# Patient Record
Sex: Female | Born: 1965 | Race: Asian | Hispanic: No | Marital: Married | Smoking: Never smoker
Health system: Southern US, Community
[De-identification: ages and names within clinical notes are randomized; demographics above are authoritative.]

## PROBLEM LIST (undated history)

## (undated) DIAGNOSIS — E039 Hypothyroidism, unspecified: Secondary | ICD-10-CM

## (undated) DIAGNOSIS — F419 Anxiety disorder, unspecified: Secondary | ICD-10-CM

## (undated) HISTORY — DX: Anxiety disorder, unspecified: F41.9

## (undated) HISTORY — PX: LAPAROSCOPIC HYSTERECTOMY: SHX1926

## (undated) HISTORY — PX: THYROIDECTOMY, PARTIAL: SHX18

---

## 2008-06-09 ENCOUNTER — Ambulatory Visit: Payer: Self-pay | Admitting: Diagnostic Radiology

## 2008-06-09 ENCOUNTER — Ambulatory Visit (HOSPITAL_BASED_OUTPATIENT_CLINIC_OR_DEPARTMENT_OTHER): Admission: RE | Admit: 2008-06-09 | Discharge: 2008-06-09 | Payer: Self-pay | Admitting: Family Medicine

## 2008-06-15 ENCOUNTER — Encounter: Admission: RE | Admit: 2008-06-15 | Discharge: 2008-06-15 | Payer: Self-pay | Admitting: Family Medicine

## 2009-09-08 ENCOUNTER — Emergency Department (HOSPITAL_BASED_OUTPATIENT_CLINIC_OR_DEPARTMENT_OTHER): Admission: EM | Admit: 2009-09-08 | Discharge: 2009-09-08 | Payer: Self-pay | Admitting: Emergency Medicine

## 2013-10-17 DIAGNOSIS — M1712 Unilateral primary osteoarthritis, left knee: Secondary | ICD-10-CM | POA: Insufficient documentation

## 2013-10-17 DIAGNOSIS — M1711 Unilateral primary osteoarthritis, right knee: Secondary | ICD-10-CM | POA: Insufficient documentation

## 2014-08-15 DIAGNOSIS — M7989 Other specified soft tissue disorders: Secondary | ICD-10-CM | POA: Insufficient documentation

## 2014-08-15 DIAGNOSIS — E039 Hypothyroidism, unspecified: Secondary | ICD-10-CM | POA: Insufficient documentation

## 2015-07-26 ENCOUNTER — Ambulatory Visit (HOSPITAL_BASED_OUTPATIENT_CLINIC_OR_DEPARTMENT_OTHER)
Admission: RE | Admit: 2015-07-26 | Discharge: 2015-07-26 | Disposition: A | Discharge status: Home / Self Care | Payer: BLUE CROSS/BLUE SHIELD | Source: Ambulatory Visit | Attending: Internal Medicine | Admitting: Internal Medicine

## 2015-07-26 ENCOUNTER — Encounter (HOSPITAL_BASED_OUTPATIENT_CLINIC_OR_DEPARTMENT_OTHER): Payer: Self-pay

## 2015-07-26 ENCOUNTER — Other Ambulatory Visit (HOSPITAL_BASED_OUTPATIENT_CLINIC_OR_DEPARTMENT_OTHER): Payer: Self-pay | Admitting: Internal Medicine

## 2015-07-26 DIAGNOSIS — R1031 Right lower quadrant pain: Secondary | ICD-10-CM | POA: Insufficient documentation

## 2015-07-26 MED ORDER — IOPAMIDOL (ISOVUE-300) INJECTION 61%
100.0000 mL | Freq: Once | INTRAVENOUS | Status: AC | PRN
Start: 2015-07-26 — End: 2015-07-26
  Administered 2015-07-26: 100 mL via INTRAVENOUS

## 2018-09-11 DIAGNOSIS — E876 Hypokalemia: Secondary | ICD-10-CM | POA: Insufficient documentation

## 2018-09-11 DIAGNOSIS — T7840XA Allergy, unspecified, initial encounter: Secondary | ICD-10-CM | POA: Diagnosis not present

## 2018-09-11 DIAGNOSIS — R220 Localized swelling, mass and lump, head: Secondary | ICD-10-CM | POA: Diagnosis present

## 2018-09-12 ENCOUNTER — Emergency Department (HOSPITAL_COMMUNITY)
Admission: EM | Admit: 2018-09-12 | Discharge: 2018-09-12 | Disposition: A | Discharge status: Home / Self Care | Payer: BC Managed Care – PPO | Attending: Emergency Medicine | Admitting: Emergency Medicine

## 2018-09-12 ENCOUNTER — Other Ambulatory Visit: Payer: Self-pay

## 2018-09-12 ENCOUNTER — Encounter (HOSPITAL_COMMUNITY): Payer: Self-pay

## 2018-09-12 DIAGNOSIS — T7840XA Allergy, unspecified, initial encounter: Secondary | ICD-10-CM

## 2018-09-12 DIAGNOSIS — E876 Hypokalemia: Secondary | ICD-10-CM

## 2018-09-12 LAB — CBC WITH DIFFERENTIAL/PLATELET
Abs Immature Granulocytes: 0.03 10*3/uL (ref 0.00–0.07)
Basophils Absolute: 0 10*3/uL (ref 0.0–0.1)
Basophils Relative: 0 %
Eosinophils Absolute: 0 10*3/uL (ref 0.0–0.5)
Eosinophils Relative: 0 %
HCT: 41.1 % (ref 36.0–46.0)
Hemoglobin: 13.2 g/dL (ref 12.0–15.0)
Immature Granulocytes: 0 %
Lymphocytes Relative: 26 %
Lymphs Abs: 3.2 10*3/uL (ref 0.7–4.0)
MCH: 28.7 pg (ref 26.0–34.0)
MCHC: 32.1 g/dL (ref 30.0–36.0)
MCV: 89.3 fL (ref 80.0–100.0)
Monocytes Absolute: 0.9 10*3/uL (ref 0.1–1.0)
Monocytes Relative: 8 %
Neutro Abs: 8 10*3/uL — ABNORMAL HIGH (ref 1.7–7.7)
Neutrophils Relative %: 66 %
Platelets: 266 10*3/uL (ref 150–400)
RBC: 4.6 MIL/uL (ref 3.87–5.11)
RDW: 13 % (ref 11.5–15.5)
WBC: 12.2 10*3/uL — ABNORMAL HIGH (ref 4.0–10.5)
nRBC: 0 % (ref 0.0–0.2)

## 2018-09-12 LAB — BASIC METABOLIC PANEL
Anion gap: 9 (ref 5–15)
BUN: 16 mg/dL (ref 6–20)
CO2: 31 mmol/L (ref 22–32)
Calcium: 10.1 mg/dL (ref 8.9–10.3)
Chloride: 105 mmol/L (ref 98–111)
Creatinine, Ser: 0.73 mg/dL (ref 0.44–1.00)
GFR calc Af Amer: >60 mL/min (ref >60)
GFR calc non Af Amer: >60 mL/min (ref >60)
Glucose, Bld: 122 mg/dL — ABNORMAL HIGH (ref 70–99)
Potassium: 3.4 mmol/L — ABNORMAL LOW (ref 3.5–5.1)
Sodium: 145 mmol/L (ref 135–145)

## 2018-09-12 MED ORDER — DEXAMETHASONE 4 MG PO TABS
10.0000 mg | ORAL_TABLET | Freq: Once | ORAL | Status: AC
  Administered 2018-09-12: 10 mg via ORAL
  Filled 2018-09-12: qty 2

## 2018-09-12 MED ORDER — POTASSIUM CHLORIDE CRYS ER 20 MEQ PO TBCR
40.0000 meq | EXTENDED_RELEASE_TABLET | Freq: Once | ORAL | Status: AC
  Administered 2018-09-12: 40 meq via ORAL
  Filled 2018-09-12: qty 2

## 2018-09-12 MED ORDER — DIPHENHYDRAMINE HCL 50 MG/ML IJ SOLN
25.0000 mg | Freq: Once | INTRAMUSCULAR | Status: AC
  Administered 2018-09-12: 02:00:00 25 mg via INTRAVENOUS
  Filled 2018-09-12: qty 1

## 2018-09-12 NOTE — Discharge Instructions (Signed)
Take diphenhydramine (Benadryl) as needed.  Return if symptoms get worse.

## 2018-09-12 NOTE — ED Provider Notes (Signed)
[facility]-EMERGENCY DEPT Provider Note   CSN: [id number] Arrival date & time: 09/11/18  2359    History   Chief Complaint Chief Complaint  Patient presents with  . Facial Swelling    HPI Claire Sullivan is a 53 y.o. female.   The history is provided by the patient.  She noted onset at about midnight of pain and swelling in the left side of her face.  Pain and swelling have improved.  She currently rates pain at 3/10.  She denies any trauma denies any new medications.  She states that she is worried about her blood pressure being high and about a stroke, but she denies any weakness or numbness or tingling.  She did not do any treatment for this.  Pain was just in the left cheek with no extension to the neck or to the forehead.  History reviewed. No pertinent past medical history.  There are no active problems to display for this patient.   History reviewed. No pertinent surgical history.   OB History   No obstetric history on file.      Home Medications    Prior to Admission medications   Not on File    Family History History reviewed. No pertinent family history.  Social History Social History   Tobacco Use  . Smoking status: Not on file  Substance Use Topics  . Alcohol use: Not on file  . Drug use: Not on file     Allergies   Patient has no known allergies.   Review of Systems Review of Systems  All other systems reviewed and are negative.    Physical Exam Updated Vital Signs BP 134/86 (BP Location: Left Arm)   Pulse 78   Temp 98.1 F (36.7 C) (Oral)   Resp 18   Ht 5\' 5"  (1.651 m)   Wt 80.7 kg   SpO2 100%   BMI 29.62 kg/m   Physical Exam Vitals signs and nursing note reviewed.    53 year old female, resting comfortably and in no acute distress. Vital signs are normal. Oxygen saturation is 100%, which is normal. Head is normocephalic and atraumatic. PERRLA, EOMI. Oropharynx is clear.  No obvious swelling or  induration.  No edema of the uvula.  No sublingual swelling. Neck is nontender and supple without adenopathy or JVD. Back is nontender and there is no CVA tenderness. Lungs are clear without rales, wheezes, or rhonchi. Chest is nontender. Heart has regular rate and rhythm without murmur. Abdomen is soft, flat, nontender without masses or hepatosplenomegaly and peristalsis is normoactive. Extremities have no cyanosis or edema, full range of motion is present. Skin is warm and dry without rash. Neurologic: Mental status is normal, cranial nerves are intact, there are no motor or sensory deficits.  ED Treatments / Results  Labs (all labs ordered are listed, but only abnormal results are displayed) Labs Reviewed  BASIC METABOLIC PANEL - Abnormal; Notable for the following components:      Result Value   Potassium 3.4 (*)    Glucose, Bld 122 (*)    All other components within normal limits  CBC WITH DIFFERENTIAL/PLATELET - Abnormal; Notable for the following components:   WBC 12.2 (*)    Neutro Abs 8.0 (*)    All other components within normal limits    Procedures Procedures  Medications Ordered in ED Medications  dexamethasone (DECADRON) tablet 10 mg (has no administration in time range)  diphenhydrAMINE (BENADRYL) injection 25 mg (25 mg Intravenous  Given 09/12/18 0201)  potassium chloride SA (K-DUR) CR tablet 40 mEq (40 mEq Oral Given 09/12/18 0328)     Initial Impression / Assessment and Plan / ED Course  I have reviewed the triage vital signs and the nursing notes.  Pertinent lab results that were available during my care of the patient were reviewed by me and considered in my medical decision making (see chart for details).  Facial pain with subjective complaint of swelling with no objective swelling on exam.  Etiology is unclear but suspect possible allergic reaction.  Patient expressed concern about hypertension but blood pressure is in the normal range.  Patient expressed  concern about stroke, but no physical findings to suggest stroke.  She will be given IV diphenhydramine and reassessed.  Old records are reviewed, and she has no relevant past visits.  Labs are significant for mild hypokalemia, and she is given a dose of oral potassium.  Symptoms improved dramatically with above-noted treatment.  She is given a dose of dexamethasone and is discharged with instructions use over-the-counter diphenhydramine as needed.  Return precautions discussed.  Final Clinical Impressions(s) / ED Diagnoses   Final diagnoses:  Allergic reaction, initial encounter  Hypokalemia    ED Discharge Orders    None       Delora Fuel, MD 19/50/93 [phone]

## 2018-09-12 NOTE — ED Triage Notes (Signed)
Pt reports swelling on the L side of her face that started about 30 minutes ago. States that it has gone down some, but swelling noted to L side of neck. She endorses pain in her whole face.

## 2020-06-28 ENCOUNTER — Emergency Department (HOSPITAL_BASED_OUTPATIENT_CLINIC_OR_DEPARTMENT_OTHER)
Admission: EM | Admit: 2020-06-28 | Discharge: 2020-06-29 | Disposition: A | Discharge status: Home / Self Care | Payer: BC Managed Care – PPO | Attending: Emergency Medicine | Admitting: Emergency Medicine

## 2020-06-28 ENCOUNTER — Encounter (HOSPITAL_BASED_OUTPATIENT_CLINIC_OR_DEPARTMENT_OTHER): Payer: Self-pay

## 2020-06-28 ENCOUNTER — Emergency Department (HOSPITAL_BASED_OUTPATIENT_CLINIC_OR_DEPARTMENT_OTHER): Payer: BC Managed Care – PPO | Admitting: Radiology

## 2020-06-28 ENCOUNTER — Other Ambulatory Visit: Payer: Self-pay

## 2020-06-28 DIAGNOSIS — N281 Cyst of kidney, acquired: Secondary | ICD-10-CM | POA: Diagnosis not present

## 2020-06-28 DIAGNOSIS — R0789 Other chest pain: Secondary | ICD-10-CM | POA: Diagnosis not present

## 2020-06-28 DIAGNOSIS — R1013 Epigastric pain: Secondary | ICD-10-CM | POA: Diagnosis present

## 2020-06-28 DIAGNOSIS — E039 Hypothyroidism, unspecified: Secondary | ICD-10-CM | POA: Insufficient documentation

## 2020-06-28 HISTORY — DX: Hypothyroidism, unspecified: E03.9

## 2020-06-28 LAB — COMPREHENSIVE METABOLIC PANEL
ALT: 43 U/L (ref 0–44)
AST: 34 U/L (ref 15–41)
Albumin: 3.8 g/dL (ref 3.5–5.0)
Alkaline Phosphatase: 88 U/L (ref 38–126)
Anion gap: 7 (ref 5–15)
BUN: 14 mg/dL (ref 6–20)
CO2: 28 mmol/L (ref 22–32)
Calcium: 9.3 mg/dL (ref 8.9–10.3)
Chloride: 106 mmol/L (ref 98–111)
Creatinine, Ser: 0.71 mg/dL (ref 0.44–1.00)
GFR, Estimated: >60 mL/min (ref >60)
Glucose, Bld: 128 mg/dL — ABNORMAL HIGH (ref 70–99)
Potassium: 3.5 mmol/L (ref 3.5–5.1)
Sodium: 141 mmol/L (ref 135–145)
Total Bilirubin: 0.2 mg/dL — ABNORMAL LOW (ref 0.3–1.2)
Total Protein: 6.3 g/dL — ABNORMAL LOW (ref 6.5–8.1)

## 2020-06-28 LAB — CBC WITH DIFFERENTIAL/PLATELET
Abs Immature Granulocytes: 0.04 10*3/uL (ref 0.00–0.07)
Basophils Absolute: 0.1 10*3/uL (ref 0.0–0.1)
Basophils Relative: 1 %
Eosinophils Absolute: 4.3 10*3/uL — ABNORMAL HIGH (ref 0.0–0.5)
Eosinophils Relative: 33 %
HCT: 40.2 % (ref 36.0–46.0)
Hemoglobin: 13.3 g/dL (ref 12.0–15.0)
Immature Granulocytes: 0 %
Lymphocytes Relative: 30 %
Lymphs Abs: 3.7 10*3/uL (ref 0.7–4.0)
MCH: 28.5 pg (ref 26.0–34.0)
MCHC: 33.1 g/dL (ref 30.0–36.0)
MCV: 86.1 fL (ref 80.0–100.0)
Monocytes Absolute: 0.7 10*3/uL (ref 0.1–1.0)
Monocytes Relative: 5 %
Neutro Abs: 3.9 10*3/uL (ref 1.7–7.7)
Neutrophils Relative %: 31 %
Platelets: 297 10*3/uL (ref 150–400)
RBC: 4.67 MIL/uL (ref 3.87–5.11)
RDW: 13.2 % (ref 11.5–15.5)
WBC: 12.7 10*3/uL — ABNORMAL HIGH (ref 4.0–10.5)
nRBC: 0 % (ref 0.0–0.2)

## 2020-06-28 LAB — TROPONIN I (HIGH SENSITIVITY): Troponin I (High Sensitivity): <2 ng/L (ref <18)

## 2020-06-28 LAB — D-DIMER, QUANTITATIVE: D-Dimer, Quant: 0.9 ug/mL-FEU — ABNORMAL HIGH (ref 0.00–0.50)

## 2020-06-28 LAB — LIPASE, BLOOD: Lipase: 24 U/L (ref 11–51)

## 2020-06-28 MED ORDER — PANTOPRAZOLE SODIUM 40 MG IV SOLR
40.0000 mg | Freq: Once | INTRAVENOUS | Status: AC
  Administered 2020-06-29: 40 mg via INTRAVENOUS
  Filled 2020-06-28: qty 40

## 2020-06-28 MED ORDER — SUCRALFATE 1 GM/10ML PO SUSP
1.0000 g | Freq: Once | ORAL | Status: AC
  Administered 2020-06-29: 1 g via ORAL
  Filled 2020-06-28: qty 10

## 2020-06-28 NOTE — ED Triage Notes (Signed)
Patient here POV with Family Member with ABD Pain and CP.  ABD Pain has been present since returning from [place] approximately 1 month PTA and was prescribed PO Antibiotics for N/V/D by PCP.   Patient here today due to worsening CP that began 5-6 days PTA.   Ambulatory, GCS 15.

## 2020-06-28 NOTE — ED Provider Notes (Signed)
[facility]ERGENC[facility] Provider Note: Lowella Dell, MD, FACEP  CSN: [id number] MRN: [id number] ARRIVAL: 06/28/20 at 2222 ROOM: DB011/DB011   CHIEF COMPLAINT  Abdominal Pain and Chest Pain   HISTORY OF PRESENT ILLNESS  06/28/20 10:49 PM Claire Sullivan is a 54 y.o. female who went to [place] from 05/06/2020 through 05/31/2020.  While she was in [place] she had diarrhea that was exacerbated anytime she ate.  She did not have abdominal pain at that time.  On returning from [place] she developed epigastric discomfort which is persisted.  She was recently treated with a course of ciprofloxacin and Flagyl which she completed 2 days ago.  She was then started on nystatin 2 days ago for reasons that are not clear.  She continues to have the epigastric pain which she currently rates as a 5 out of 10, worse with palpation and worse with eating.  For the past week when she eats the pain radiates up into her chest, notably her left upper chest which she describes as not a pain but a sensation that she is going to die.  This chest discomfort is intermittently present even when she has not recently eaten.  It is exacerbated by deep breathing.  She thinks it may also be brought on by stress.  She has had nausea but no vomiting.  Her diarrhea has resolved.   Past Medical History:  Diagnosis Date  . Hypothyroidism     History reviewed. No pertinent surgical history.  No family history on file.  Social History   Tobacco Use  . Smoking status: Never Smoker  . Smokeless tobacco: Never Used  Substance Use Topics  . Alcohol use: Never  . Drug use: Never    Prior to Admission medications   Medication Sig Start Date End Date Taking? Authorizing Provider  ondansetron (ZOFRAN ODT) 8 MG disintegrating tablet Take 1 tablet (8 mg total) by mouth every 8 (eight) hours as needed for nausea or vomiting. 06/29/20  Yes Tanijah Morais, MD  pantoprazole (PROTONIX) 20 MG tablet Take 1 tablet (20 mg total) by mouth daily.  06/29/20  Yes Angelito Hopping, MD    Allergies Patient has no known allergies.   REVIEW OF SYSTEMS  Negative except as noted here or in the History of Present Illness.   PHYSICAL EXAMINATION  Initial Vital Signs Blood pressure (!) 144/90, pulse 72, temperature 98.7 F (37.1 C), temperature source Oral, resp. rate 14, height 5\' 5"  (1.651 m), weight 80.7 kg, SpO2 100 %.  Examination General: Well-developed, well-nourished female in no acute distress; appearance consistent with age of record HENT: normocephalic; atraumatic Eyes: pupils equal, round and reactive to light; extraocular muscles intact Neck: supple Heart: regular rate and rhythm Lungs: clear to auscultation bilaterally Chest: Nontender Abdomen: soft; nondistended; epigastric tenderness; bowel sounds present Extremities: No deformity; full range of motion; pulses normal Neurologic: Awake, alert and oriented; motor function intact in all extremities and symmetric; no facial droop Skin: Warm and dry Psychiatric: Normal mood and affect   RESULTS  Summary of this visit's results, reviewed and interpreted by myself:   EKG Interpretation  Date/Time:  Thursday Jun 28 2020 22:37:04 EDT Ventricular Rate:  71 PR Interval:  121 QRS Duration: 90 QT Interval:  391 QTC Calculation: 425 R Axis:   83 Text Interpretation: Duplicate Delete Confirmed by [id number] (Paula Libra) on 06/28/2020 10:40:16 PM      Laboratory Studies: Results for orders placed or performed during the hospital encounter of 06/28/20 (from the past 24  hour(s))  CBC with Differential/Platelet     Status: Abnormal   Collection Time: 06/28/20 11:02 PM  Result Value Ref Range   WBC 12.7 (H) 4.0 - 10.5 K/uL   RBC 4.67 3.87 - 5.11 MIL/uL   Hemoglobin 13.3 12.0 - 15.0 g/dL   HCT 51.7 61.6 - 07.3 %   MCV 86.1 80.0 - 100.0 fL   MCH 28.5 26.0 - 34.0 pg   MCHC 33.1 30.0 - 36.0 g/dL   RDW 71.0 62.6 - 94.8 %   Platelets 297 150 - 400 K/uL   nRBC 0.0 0.0 - 0.2 %    Neutrophils Relative % 31 %   Neutro Abs 3.9 1.7 - 7.7 K/uL   Lymphocytes Relative 30 %   Lymphs Abs 3.7 0.7 - 4.0 K/uL   Monocytes Relative 5 %   Monocytes Absolute 0.7 0.1 - 1.0 K/uL   Eosinophils Relative 33 %   Eosinophils Absolute 4.3 (H) 0.0 - 0.5 K/uL   Basophils Relative 1 %   Basophils Absolute 0.1 0.0 - 0.1 K/uL   Immature Granulocytes 0 %   Abs Immature Granulocytes 0.04 0.00 - 0.07 K/uL  Comprehensive metabolic panel     Status: Abnormal   Collection Time: 06/28/20 11:02 PM  Result Value Ref Range   Sodium 141 135 - 145 mmol/L   Potassium 3.5 3.5 - 5.1 mmol/L   Chloride 106 98 - 111 mmol/L   CO2 28 22 - 32 mmol/L   Glucose, Bld 128 (H) 70 - 99 mg/dL   BUN 14 6 - 20 mg/dL   Creatinine, Ser 5.46 0.44 - 1.00 mg/dL   Calcium 9.3 8.9 - 27.0 mg/dL   Total Protein 6.3 (L) 6.5 - 8.1 g/dL   Albumin 3.8 3.5 - 5.0 g/dL   AST 34 15 - 41 U/L   ALT 43 0 - 44 U/L   Alkaline Phosphatase 88 38 - 126 U/L   Total Bilirubin 0.2 (L) 0.3 - 1.2 mg/dL   GFR, Estimated >35 >00 mL/min   Anion gap 7 5 - 15  Lipase, blood     Status: None   Collection Time: 06/28/20 11:02 PM  Result Value Ref Range   Lipase 24 11 - 51 U/L  Troponin I (High Sensitivity)     Status: None   Collection Time: 06/28/20 11:02 PM  Result Value Ref Range   Troponin I (High Sensitivity) <2 <18 ng/L  D-dimer, quantitative     Status: Abnormal   Collection Time: 06/28/20 11:20 PM  Result Value Ref Range   D-Dimer, Quant 0.90 (H) 0.00 - 0.50 ug/mL-FEU   Imaging Studies: DG Chest 2 View  Result Date: 06/28/2020 CLINICAL DATA:  Left upper chest discomfort since returning from [place] 1 month prior to admission EXAM: CHEST - 2 VIEW COMPARISON:  None. FINDINGS: Mildly coarsened interstitial changes are nonspecific. No consolidative opacity, convincing edema, pneumothorax or effusion. The cardiomediastinal contours are unremarkable. No acute osseous or soft tissue abnormality. Telemetry leads overlie the chest.  IMPRESSION: Nonspecific coarsened interstitial changes, can be infectious, inflammatory or chronic. No other acute cardiopulmonary abnormality. Electronically Signed   By: Kreg Shropshire M.D.   On: 06/28/2020 23:20   CT Angio Chest PE W and/or Wo Contrast  Result Date: 06/29/2020 CLINICAL DATA:  Abdominal pain. EXAM: CT ANGIOGRAPHY CHEST WITH CONTRAST TECHNIQUE: Multidetector CT imaging of the chest was performed using the standard protocol during bolus administration of intravenous contrast. Multiplanar CT image reconstructions and MIPs were obtained to evaluate  the vascular anatomy. CONTRAST:  OMNIPAQUE IOHEXOL 350 MG/ML SOLN COMPARISON:  None. FINDINGS: Cardiovascular: Satisfactory opacification of the pulmonary arteries to the segmental level. No evidence of pulmonary embolism. Normal heart size. No pericardial effusion. Mediastinum/Nodes: There is mild right hilar lymphadenopathy. Thyroid gland, trachea, and esophagus demonstrate no significant findings. Lungs/Pleura: Lungs are clear. No pleural effusion or pneumothorax. Upper Abdomen: There is a small hiatal hernia. A 7.2 cm x 5.4 cm cystic appearing area is seen within the posterior aspect of the left upper quadrant. Musculoskeletal: No chest wall abnormality. No acute or significant osseous findings. Review of the MIP images confirms the above findings. IMPRESSION: 1. No evidence of pulmonary embolism or acute cardiopulmonary disease. 2. Large posterior left upper quadrant cyst. This may be renal in origin. Correlation with renal ultrasound is recommended. Electronically Signed   By: Aram Candela M.D.   On: 06/29/2020 00:30   US Renal  Result Date: 06/29/2020 CLINICAL DATA:  Chest and abdominal pain EXAM: RENAL / URINARY TRACT ULTRASOUND COMPLETE COMPARISON:  CT 06/29/2020 FINDINGS: Right Kidney: Renal measurements: 10.6 x 4.3 x 5.1 cm = volume: 100.9 mL. Echogenicity is within normal limits. No concerning renal mass, shadowing calculus or  hydronephrosis. Left Kidney: Renal measurements: 14.2 x 6.3 x 5.7 cm = volume: 265.8 mL. Solitary large predominantly anechoic cyst with minimal internal echogenic debris arising from the upper pole left kidney measuring 7.4 x 7.5 x 7.2 cm in size. No other focal renal lesion, shadowing calculus or hydronephrosis. Echogenicity is within normal limits. Bladder: Appears normal for degree of bladder distention. Other: None. IMPRESSION: Large cyst seen on comparison CT appears to arise from the upper pole left kidney with only minimal internal complexity with few low level internal echoes/debris. No acute or concerning features are evident. Otherwise unremarkable renal ultrasound. Electronically Signed   By: Kreg Shropshire M.D.   On: 06/29/2020 01:55    ED COURSE and MDM  Nursing notes, initial and subsequent vitals signs, including pulse oximetry, reviewed and interpreted by myself.  Vitals:   06/28/20 2330 06/29/20 0000 06/29/20 0030 06/29/20 0100  BP: 134/84 140/86 136/90 129/79  Pulse: 65 63 67 71  Resp: 17 16 17 17   Temp:      TempSrc:      SpO2: 100% 99% 100% 100%  Weight:      Height:       Medications  pantoprazole (PROTONIX) injection 40 mg (40 mg Intravenous Given 06/29/20 0019)  sucralfate (CARAFATE) 1 GM/10ML suspension 1 g (1 g Oral Given 06/29/20 0019)  iohexol (OMNIPAQUE) 350 MG/ML injection 100 mL (100 mLs Intravenous Contrast Given 06/29/20 0006)   1:59 AM Patient got symptomatic relief from oral Carafate.  She was also given IV Protonix.  I suspect her symptoms may be due to gastritis and/or acid reflux.  We will start her on a PPI.  She has no significant laboratory abnormalities and imaging studies are significant only for a benign-appearing left renal cyst.   PROCEDURES  Procedures   ED DIAGNOSES     ICD-10-CM   1. Epigastric pain  R10.13   2. Discomfort in chest  R07.89   3. Acquired renal cyst of left kidney  N28.1       Geneveive Furness, MD 06/29/20 0202

## 2020-06-29 ENCOUNTER — Emergency Department (HOSPITAL_BASED_OUTPATIENT_CLINIC_OR_DEPARTMENT_OTHER): Payer: BC Managed Care – PPO

## 2020-06-29 ENCOUNTER — Encounter (HOSPITAL_BASED_OUTPATIENT_CLINIC_OR_DEPARTMENT_OTHER): Payer: Self-pay | Admitting: Emergency Medicine

## 2020-06-29 LAB — URINALYSIS, ROUTINE W REFLEX MICROSCOPIC
Bilirubin Urine: NEGATIVE
Glucose, UA: NEGATIVE mg/dL
Ketones, ur: NEGATIVE mg/dL
Nitrite: NEGATIVE
Protein, ur: NEGATIVE mg/dL
Specific Gravity, Urine: 1.024 (ref 1.005–1.030)
pH: 7 (ref 5.0–8.0)

## 2020-06-29 MED ORDER — IOHEXOL 350 MG/ML SOLN
100.0000 mL | Freq: Once | INTRAVENOUS | Status: AC | PRN
  Administered 2020-06-29: 100 mL via INTRAVENOUS

## 2020-06-29 MED ORDER — PANTOPRAZOLE SODIUM 20 MG PO TBEC: 20.0000 mg | DELAYED_RELEASE_TABLET | Freq: Every day | ORAL | 0 refills | Status: DC

## 2020-06-29 MED ORDER — ONDANSETRON 8 MG PO TBDP: 8.0000 mg | ORAL_TABLET | Freq: Three times a day (TID) | ORAL | 0 refills | Status: DC | PRN

## 2020-07-09 ENCOUNTER — Telehealth: Payer: Self-pay

## 2020-07-09 NOTE — Telephone Encounter (Signed)
Called and left voicemail for patient to call and schedule an appointment. Patient will be worked in Tuesday or Friday. Need to schedule with Nehemiah Settle

## 2020-07-13 ENCOUNTER — Encounter: Payer: Self-pay | Admitting: Gastroenterology

## 2020-07-13 ENCOUNTER — Other Ambulatory Visit: Payer: Self-pay

## 2020-07-13 ENCOUNTER — Ambulatory Visit (HOSPITAL_BASED_OUTPATIENT_CLINIC_OR_DEPARTMENT_OTHER)
Admission: RE | Admit: 2020-07-13 | Discharge: 2020-07-13 | Disposition: A | Discharge status: Home / Self Care | Payer: BC Managed Care – PPO | Source: Ambulatory Visit | Attending: Gastroenterology | Admitting: Gastroenterology

## 2020-07-13 ENCOUNTER — Ambulatory Visit (INDEPENDENT_AMBULATORY_CARE_PROVIDER_SITE_OTHER): Payer: BC Managed Care – PPO | Admitting: Gastroenterology

## 2020-07-13 VITALS — BP 150/98 | HR 79 | Ht 65.0 in | Wt 173.2 lb

## 2020-07-13 DIAGNOSIS — R1013 Epigastric pain: Secondary | ICD-10-CM

## 2020-07-13 DIAGNOSIS — R1084 Generalized abdominal pain: Secondary | ICD-10-CM

## 2020-07-13 DIAGNOSIS — R52 Pain, unspecified: Secondary | ICD-10-CM | POA: Diagnosis not present

## 2020-07-13 MED ORDER — IOHEXOL 300 MG/ML  SOLN
75.0000 mL | Freq: Once | INTRAMUSCULAR | Status: AC
  Administered 2020-07-13: 75 mL via INTRAVENOUS

## 2020-07-13 MED ORDER — AMBULATORY NON FORMULARY MEDICATION: 30.0000 mL | Freq: Four times a day (QID) | 2 refills | Status: DC | PRN

## 2020-07-13 NOTE — Progress Notes (Signed)
Chief Complaint:   Referring Provider:  Galvin Proffer, MD      ASSESSMENT AND PLAN;   #1. Epi pain  #2. Gen abdo pain with RLQ tenderness. R/O acute appendicitis.   Plan: -CT AP with PO IV contrast STAT -If CT is negative, proceed with EGD for further evaluation. -For now continue Protonix 40 mg p.o. once a day -Trial of GI cocktail (equal amounts of viscous lidocaine, Maalox and liquid Bentyl) -I have reviewed the CTA chest, labs, recent ED visit in detail.   HPI:    Claire Sullivan is a 55 y.o. female  Accompanied by her husband Seen as emergency workin (cousin of Dr.Haque) Visited [place] 3/13-05/31/2020 Had severe diarrhea, treated with cipro/flagyl with good relief Now with severe epigastric/generalized abdominal pain with postprandial abdominal burning, bloating.  No fevers but does feel chilly.  Has significant postprandial heartburn.  This was associated with some shortness of breath.  Evaluated in ED on 06/28/2020 underwent CTA chest-negative for PE, stable large 7.5 cm posterior left kidney cyst, confirmed by renal ultrasound.  She had mildly elevated WBC count at 12.7 with normal hemoglobin 13.3, normal CMP except glucose 128, normal lipase.  MI was ruled out.  She was given IV Protonix, Carafate.  Sent to GI clinic for further evaluation.  Lately has been having lower abdominal pain without diarrhea.  No melena or hematochezia.  She denies having any nausea or vomiting.  Very distressed with symptoms.  Has associated significant situational anxiety (both daughter-in-laws stayed back in [place])    Past Medical History:  Diagnosis Date  . Hypothyroidism     Past Surgical History:  Procedure Laterality Date  . LAPAROSCOPIC HYSTERECTOMY      Family History  Problem Relation Age of Onset  . CVA Mother   . Kidney disease Father     Social History   Tobacco Use  . Smoking status: Never Smoker  . Smokeless tobacco: Never Used  Vaping Use  . Vaping  Use: Never used  Substance Use Topics  . Alcohol use: Never  . Drug use: Never   . Current Outpatient Medications  Medication Sig Dispense Refill  . alprazolam (XANAX) 2 MG tablet Take by mouth.    . levothyroxine (SYNTHROID) 150 MCG tablet Take 150 mcg by mouth.    . pantoprazole (PROTONIX) 20 MG tablet Take 1 tablet (20 mg total) by mouth daily. 30 tablet 0   No current facility-administered medications for this visit.    No Known Allergies  Review of Systems:  Constitutional: Denies fever, chills, diaphoresis, has appetite change and fatigue.  HEENT: Denies photophobia, eye pain, redness, hearing loss, ear pain, congestion, sore throat, rhinorrhea, sneezing, mouth sores, neck pain, neck stiffness and tinnitus.   Respiratory: Denies SOB, DOE, cough, chest tightness,  and wheezing.   Cardiovascular: Denies chest pain, palpitations and leg swelling.  Genitourinary: Denies dysuria, urgency, frequency, hematuria, flank pain and difficulty urinating.  Musculoskeletal: Denies myalgias, back pain, joint swelling, arthralgias and gait problem.  Skin: No rash.  Neurological: Denies dizziness, seizures, syncope, weakness, light-headedness, numbness and headaches.  Hematological: Denies adenopathy. Easy bruising, personal or family bleeding history  Psychiatric/Behavioral: has anxiety or depression     Physical Exam:    BP (!) 150/98 (BP Location: Left Arm, Patient Position: Sitting, Cuff Size: Normal)   Pulse 79   Ht 5\' 5"  (1.651 m)   Wt 173 lb 4 oz (78.6 kg)   BMI 28.83 kg/m  Wt Readings from Last  3 Encounters:  07/13/20 173 lb 4 oz (78.6 kg)  06/28/20 177 lb 14.6 oz (80.7 kg)  09/12/18 178 lb (80.7 kg)   Constitutional:  Well-developed, in no acute distress. Psychiatric: Normal mood and affect. Behavior is normal. HEENT: Pupils normal.  Conjunctivae are normal. No scleral icterus. Neck supple.  Cardiovascular: Normal rate, regular rhythm. No edema Pulmonary/chest: Effort  normal and breath sounds normal. No wheezing, rales or rhonchi. Abdominal: Soft, nondistended.  Generalized abdominal tenderness particularly in the right lower quadrant.  No rebound in the right lower quadrant.  She does have mild rebound in the left lower quadrant.  Bowel sounds active throughout. There are no masses palpable. No hepatomegaly. Rectal: Deferred Neurological: Alert and oriented to person place and time. Skin: Skin is warm and dry. No rashes noted.  Data Reviewed: I have personally reviewed following labs and imaging studies  CBC: CBC Latest Ref Rng & Units 06/28/2020 09/12/2018  WBC 4.0 - 10.5 K/uL 12.7(H) 12.2(H)  Hemoglobin 12.0 - 15.0 g/dL 37.6 28.3  Hematocrit 15.1 - 46.0 % 40.2 41.1  Platelets 150 - 400 K/uL 297 266    CMP: CMP Latest Ref Rng & Units 06/28/2020 09/12/2018  Glucose 70 - 99 mg/dL 761(Y) 073(X)  BUN 6 - 20 mg/dL 14 16  Creatinine 1.06 - 1.00 mg/dL 2.69 4.85  Sodium 462 - 145 mmol/L 141 145  Potassium 3.5 - 5.1 mmol/L 3.5 3.4(L)  Chloride 98 - 111 mmol/L 106 105  CO2 22 - 32 mmol/L 28 31  Calcium 8.9 - 10.3 mg/dL 9.3 70.3  Total Protein 6.5 - 8.1 g/dL 6.3(L) -  Total Bilirubin 0.3 - 1.2 mg/dL 5.0(K) -  Alkaline Phos 38 - 126 U/L 88 -  AST 15 - 41 U/L 34 -  ALT 0 - 44 U/L 43 -      Radiology Studies: DG Chest 2 View  Result Date: 06/28/2020 CLINICAL DATA:  Left upper chest discomfort since returning from [place] 1 month prior to admission EXAM: CHEST - 2 VIEW COMPARISON:  None. FINDINGS: Mildly coarsened interstitial changes are nonspecific. No consolidative opacity, convincing edema, pneumothorax or effusion. The cardiomediastinal contours are unremarkable. No acute osseous or soft tissue abnormality. Telemetry leads overlie the chest. IMPRESSION: Nonspecific coarsened interstitial changes, can be infectious, inflammatory or chronic. No other acute cardiopulmonary abnormality. Electronically Signed   By: Kreg Shropshire M.D.   On: 06/28/2020 23:20    CT Angio Chest PE W and/or Wo Contrast  Result Date: 06/29/2020 CLINICAL DATA:  Abdominal pain. EXAM: CT ANGIOGRAPHY CHEST WITH CONTRAST TECHNIQUE: Multidetector CT imaging of the chest was performed using the standard protocol during bolus administration of intravenous contrast. Multiplanar CT image reconstructions and MIPs were obtained to evaluate the vascular anatomy. CONTRAST:  OMNIPAQUE IOHEXOL 350 MG/ML SOLN COMPARISON:  None. FINDINGS: Cardiovascular: Satisfactory opacification of the pulmonary arteries to the segmental level. No evidence of pulmonary embolism. Normal heart size. No pericardial effusion. Mediastinum/Nodes: There is mild right hilar lymphadenopathy. Thyroid gland, trachea, and esophagus demonstrate no significant findings. Lungs/Pleura: Lungs are clear. No pleural effusion or pneumothorax. Upper Abdomen: There is a small hiatal hernia. A 7.2 cm x 5.4 cm cystic appearing area is seen within the posterior aspect of the left upper quadrant. Musculoskeletal: No chest wall abnormality. No acute or significant osseous findings. Review of the MIP images confirms the above findings. IMPRESSION: 1. No evidence of pulmonary embolism or acute cardiopulmonary disease. 2. Large posterior left upper quadrant cyst. This may be renal in  origin. Correlation with renal ultrasound is recommended. Electronically Signed   By: Aram Candela M.D.   On: 06/29/2020 00:30   US Renal  Result Date: 06/29/2020 CLINICAL DATA:  Chest and abdominal pain EXAM: RENAL / URINARY TRACT ULTRASOUND COMPLETE COMPARISON:  CT 06/29/2020 FINDINGS: Right Kidney: Renal measurements: 10.6 x 4.3 x 5.1 cm = volume: 100.9 mL. Echogenicity is within normal limits. No concerning renal mass, shadowing calculus or hydronephrosis. Left Kidney: Renal measurements: 14.2 x 6.3 x 5.7 cm = volume: 265.8 mL. Solitary large predominantly anechoic cyst with minimal internal echogenic debris arising from the upper pole left kidney  measuring 7.4 x 7.5 x 7.2 cm in size. No other focal renal lesion, shadowing calculus or hydronephrosis. Echogenicity is within normal limits. Bladder: Appears normal for degree of bladder distention. Other: None. IMPRESSION: Large cyst seen on comparison CT appears to arise from the upper pole left kidney with only minimal internal complexity with few low level internal echoes/debris. No acute or concerning features are evident. Otherwise unremarkable renal ultrasound. Electronically Signed   By: Kreg Shropshire M.D.   On: 06/29/2020 01:55      Edman Circle, MD 07/13/2020, 1:54 PM  Cc: Galvin Proffer, MD

## 2020-07-13 NOTE — Patient Instructions (Addendum)
If you are age 56 or older, your body mass index should be between 23-30. Your Body mass index is 28.83 kg/m. If this is out of the aforementioned range listed, please consider follow up with your Primary Care Provider.  If you are age 71 or younger, your body mass index should be between 19-25. Your Body mass index is 28.83 kg/m. If this is out of the aformentioned range listed, please consider follow up with your Primary Care Provider.   You have [facility] [place] [address] [facility]   You are scheduled on       at         . You should arrive 15 minutes prior to your appointment time for registration. Please follow the written instructions below on the day of your exam:  WARNING: IF YOU ARE ALLERGIC TO IODINE/X-RAY DYE, PLEASE NOTIFY RADIOLOGY IMMEDIATELY AT [phone]! YOU WILL BE GIVEN A 13 HOUR PREMEDICATION PREP.  1) Do not eat or drink anything after        (4 hours prior to your test) 2) You have been given 2 bottles of oral contrast to drink. The solution may taste better if refrigerated, but do NOT add ice or any other liquid to this solution. Shake well before drinking.    Drink 1 bottle of contrast @      (2 hours prior to your exam)  Drink 1 bottle of contrast @      (1 hour prior to your exam)  You may take any medications as prescribed with a small amount of water, if necessary. If you take any of the following medications: METFORMIN, GLUCOPHAGE, GLUCOVANCE, AVANDAMET, RIOMET, FORTAMET, [place] MET, JANUMET, GLUMETZA or METAGLIP, you MAY be asked to HOLD this medication 48 hours AFTER the exam.  The purpose of you drinking the oral contrast is to aid in the visualization of your intestinal tract. The contrast solution may cause some diarrhea. Depending on your individual set of symptoms, you may also receive an intravenous injection of x-ray contrast/dye. Plan on being at  [facility] for 30 minutes or longer, depending on the type of exam you are having performed.  This test typically takes 30-45 minutes to complete.  If you have any questions regarding your exam or if you need to reschedule, you may call the CT department at [phone] between the hours of 8:00 am and 5:00 pm, Monday-Friday.  ________________________________________________________________________   We have sent the following medications to your pharmacy for you to pick up at your convenience: GI cocktail  [facility] information is below: Address: [address] [place],  [address]  [phone]  *Please DO NOT go directly from our office to pick up this medication! Give the pharmacy 1 day to process the prescription as this is compounded and takes time to make.  Thank you,  Dr. Jackquline Denmark

## 2020-07-15 ENCOUNTER — Encounter: Payer: Self-pay | Admitting: Certified Registered Nurse Anesthetist

## 2020-07-16 ENCOUNTER — Other Ambulatory Visit: Payer: Self-pay

## 2020-07-16 ENCOUNTER — Ambulatory Visit (AMBULATORY_SURGERY_CENTER): Payer: BC Managed Care – PPO | Admitting: Gastroenterology

## 2020-07-16 ENCOUNTER — Encounter: Payer: Self-pay | Admitting: Gastroenterology

## 2020-07-16 VITALS — BP 96/63 | HR 71 | Temp 97.3°F | Resp 17 | Ht 65.0 in | Wt 173.0 lb

## 2020-07-16 DIAGNOSIS — K2980 Duodenitis without bleeding: Secondary | ICD-10-CM | POA: Diagnosis not present

## 2020-07-16 DIAGNOSIS — K2970 Gastritis, unspecified, without bleeding: Secondary | ICD-10-CM | POA: Diagnosis not present

## 2020-07-16 DIAGNOSIS — K269 Duodenal ulcer, unspecified as acute or chronic, without hemorrhage or perforation: Secondary | ICD-10-CM | POA: Diagnosis not present

## 2020-07-16 DIAGNOSIS — R1013 Epigastric pain: Secondary | ICD-10-CM

## 2020-07-16 DIAGNOSIS — K2950 Unspecified chronic gastritis without bleeding: Secondary | ICD-10-CM

## 2020-07-16 DIAGNOSIS — R1084 Generalized abdominal pain: Secondary | ICD-10-CM

## 2020-07-16 DIAGNOSIS — K449 Diaphragmatic hernia without obstruction or gangrene: Secondary | ICD-10-CM | POA: Diagnosis not present

## 2020-07-16 MED ORDER — PANTOPRAZOLE SODIUM 40 MG PO TBEC
40.0000 mg | DELAYED_RELEASE_TABLET | Freq: Two times a day (BID) | ORAL | 3 refills | Status: DC
Start: 2020-07-16 — End: 2021-04-23

## 2020-07-16 MED ORDER — SODIUM CHLORIDE 0.9 % IV SOLN: 500.0000 mL | Freq: Once | INTRAVENOUS | Status: DC

## 2020-07-16 NOTE — Op Note (Signed)
[facility] Patient Name: Claire Sullivan Procedure Date: 07/16/2020 7:50 AM MRN: [id number] Endoscopist: Lynann Bolognaajesh Charmayne Odell , MD Age: 55 Referring MD:  Date of Birth: 10/23/1965 Gender: Female Account #: [id number] Procedure:                Upper GI endoscopy Indications:              Epigastric abdominal pain with negative CT                            Abdo/pelvis Medicines:                Monitored Anesthesia Care Procedure:                Pre-Anesthesia Assessment:                           - Prior to the procedure, a History and Physical                            was performed, and patient medications and                            allergies were reviewed. The patient's tolerance of                            previous anesthesia was also reviewed. The risks                            and benefits of the procedure and the sedation                            options and risks were discussed with the patient.                            All questions were answered, and informed consent                            was obtained. Prior Anticoagulants: The patient has                            taken no previous anticoagulant or antiplatelet                            agents. ASA Grade Assessment: II - A patient with                            mild systemic disease. After reviewing the risks                            and benefits, the patient was deemed in                            satisfactory condition to undergo the procedure.  After obtaining informed consent, the endoscope was                            passed under direct vision. Throughout the                            procedure, the patient's blood pressure, pulse, and                            oxygen saturations were monitored continuously. The                            Endoscope was introduced through the mouth, and                            advanced to the second part of duodenum. The upper                             GI endoscopy was accomplished without difficulty.                            The patient tolerated the procedure well. Scope In: Scope Out: Findings:                 The examined esophagus was normal with the Z-line                            at 36 cm, examined by NBI.                           A small hiatal hernia was present.                           Diffuse moderate inflammation characterized by                            erythema was found in the gastric antrum. Biopsies                            were taken with a cold forceps for histology.                           Three non-bleeding superficial but cratered                            duodenal ulcers with no stigmata of bleeding were                            found in the second portion of the duodenum. The                            largest lesion was 10 mm in largest dimension.  Biopsies were taken with a cold forceps for                            histology. Complications:            No immediate complications. Estimated Blood Loss:     Estimated blood loss: none. Impression:               - Small hiatal hernia.                           - Gastritis. Biopsied.                           - Non-bleeding duodenal ulcers with no stigmata of                            bleeding. Biopsied. Recommendation:           - Patient has a contact number available for                            emergencies. The signs and symptoms of potential                            delayed complications were discussed with the                            patient. Return to normal activities tomorrow.                            Written discharge instructions were provided to the                            patient.                           - Resume previous diet.                           - Use Protonix (pantoprazole) 40 mg PO BID for 12                            weeks.                           - No  aspirin, ibuprofen, naproxen, or other                            non-steroidal anti-inflammatory drugs.                           - Await pathology results.                           - The findings and recommendations were discussed  with the patient's family.                           - Recommend enteroscopy in 12 weeks at [facility]. If                            still with problems, would consider VCE. Note that                            she had recent trip to [place] and got really sick                            there. Lynann Bologna, MD 07/16/2020 8:19:21 AM This report has been signed electronically.

## 2020-07-16 NOTE — Progress Notes (Signed)
Called to room to assist during endoscopic procedure.  Patient ID and intended procedure confirmed with present staff. Received instructions for my participation in the procedure from the performing physician.  

## 2020-07-16 NOTE — Progress Notes (Signed)
Report given to PACU, vss 

## 2020-07-16 NOTE — Patient Instructions (Signed)
YOU HAD AN ENDOSCOPIC PROCEDURE TODAY AT THE [facility]:   Refer to the procedure report that was given to you for any specific questions about what was found during the examination.  If the procedure report does not answer your questions, please call your gastroenterologist to clarify.  If you requested that your care partner not be given the details of your procedure findings, then the procedure report has been included in a sealed envelope for you to review at your convenience later.  YOU SHOULD EXPECT: Some feelings of bloating in the abdomen. Passage of more gas than usual.  Walking can help get rid of the air that was put into your GI tract during the procedure and reduce the bloating. If you had a lower endoscopy (such as a colonoscopy or flexible sigmoidoscopy) you may notice spotting of blood in your stool or on the toilet paper. If you underwent a bowel prep for your procedure, you may not have a normal bowel movement for a few days.  Please Note:  You might notice some irritation and congestion in your nose or some drainage.  This is from the oxygen used during your procedure.  There is no need for concern and it should clear up in a day or so.  SYMPTOMS TO REPORT IMMEDIATELY:   Following lower endoscopy (colonoscopy or flexible sigmoidoscopy):  Excessive amounts of blood in the stool  Significant tenderness or worsening of abdominal pains  Swelling of the abdomen that is new, acute  Fever of 100F or higher   Following upper endoscopy (EGD)  Vomiting of blood or coffee ground material  New chest pain or pain under the shoulder blades  Painful or persistently difficult swallowing  New shortness of breath  Fever of 100F or higher  Black, tarry-looking stools  For urgent or emergent issues, a gastroenterologist can be reached at any hour by calling [phone]. Do not use MyChart messaging for urgent concerns.    DIET:  We do recommend a small meal at first, but  then you may proceed to your regular diet.  Drink plenty of fluids but you should avoid alcoholic beverages for 24 hours.  ACTIVITY:  You should plan to take it easy for the rest of today and you should NOT DRIVE or use heavy machinery until tomorrow (because of the sedation medicines used during the test).    FOLLOW UP: Our staff will call the number listed on your records 48-72 hours following your procedure to check on you and address any questions or concerns that you may have regarding the information given to you following your procedure. If we do not reach you, we will leave a message.  We will attempt to reach you two times.  During this call, we will ask if you have developed any symptoms of COVID 19. If you develop any symptoms (ie: fever, flu-like symptoms, shortness of breath, cough etc.) before then, please call [phone].  If you test positive for Covid 19 in the 2 weeks post procedure, please call and report this information to us.    If any biopsies were taken you will be contacted by phone or by letter within the next 1-3 weeks.  Please call us at [phone] if you have not heard about the biopsies in 3 weeks.    SIGNATURES/CONFIDENTIALITY: You and/or your care partner have signed paperwork which will be entered into your electronic medical record.  These signatures attest to the fact that that the information above on   your After Visit Summary has been reviewed and is understood.  Full responsibility of the confidentiality of this discharge information lies with you and/or your care-partner. 

## 2020-07-16 NOTE — Progress Notes (Signed)
0759 Robinul 0.1 mg IV given due large amount of secretions upon assessment.  MD made aware, vss 

## 2020-07-17 ENCOUNTER — Ambulatory Visit: Payer: BC Managed Care – PPO | Admitting: Gastroenterology

## 2020-07-18 ENCOUNTER — Telehealth: Payer: Self-pay

## 2020-07-18 ENCOUNTER — Telehealth: Payer: Self-pay | Admitting: *Deleted

## 2020-07-18 NOTE — Telephone Encounter (Signed)
Message left

## 2020-07-18 NOTE — Telephone Encounter (Signed)
No answer, left message to call back later today, B.Cinda Hara RN. 

## 2020-07-26 ENCOUNTER — Encounter: Payer: Self-pay | Admitting: Gastroenterology

## 2021-02-01 DIAGNOSIS — J101 Influenza due to other identified influenza virus with other respiratory manifestations: Secondary | ICD-10-CM | POA: Diagnosis not present

## 2021-02-01 DIAGNOSIS — J069 Acute upper respiratory infection, unspecified: Secondary | ICD-10-CM | POA: Diagnosis not present

## 2021-02-01 DIAGNOSIS — Z20828 Contact with and (suspected) exposure to other viral communicable diseases: Secondary | ICD-10-CM | POA: Diagnosis not present

## 2021-02-01 DIAGNOSIS — U071 COVID-19: Secondary | ICD-10-CM | POA: Diagnosis not present

## 2021-02-01 DIAGNOSIS — B974 Respiratory syncytial virus as the cause of diseases classified elsewhere: Secondary | ICD-10-CM | POA: Diagnosis not present

## 2021-02-01 DIAGNOSIS — R059 Cough, unspecified: Secondary | ICD-10-CM | POA: Diagnosis not present

## 2021-04-23 ENCOUNTER — Other Ambulatory Visit: Payer: Self-pay | Admitting: Gastroenterology

## 2021-04-23 DIAGNOSIS — R1013 Epigastric pain: Secondary | ICD-10-CM

## 2021-04-23 DIAGNOSIS — R1084 Generalized abdominal pain: Secondary | ICD-10-CM

## 2021-06-05 DIAGNOSIS — E559 Vitamin D deficiency, unspecified: Secondary | ICD-10-CM | POA: Diagnosis not present

## 2021-06-05 DIAGNOSIS — R1084 Generalized abdominal pain: Secondary | ICD-10-CM | POA: Diagnosis not present

## 2021-06-05 DIAGNOSIS — I10 Essential (primary) hypertension: Secondary | ICD-10-CM | POA: Diagnosis not present

## 2021-06-05 DIAGNOSIS — Z79899 Other long term (current) drug therapy: Secondary | ICD-10-CM | POA: Diagnosis not present

## 2021-06-05 DIAGNOSIS — E7849 Other hyperlipidemia: Secondary | ICD-10-CM | POA: Diagnosis not present

## 2021-06-05 DIAGNOSIS — E038 Other specified hypothyroidism: Secondary | ICD-10-CM | POA: Diagnosis not present

## 2021-06-06 DIAGNOSIS — R1032 Left lower quadrant pain: Secondary | ICD-10-CM | POA: Diagnosis not present

## 2021-06-07 ENCOUNTER — Other Ambulatory Visit: Payer: Self-pay

## 2021-06-07 DIAGNOSIS — R1084 Generalized abdominal pain: Secondary | ICD-10-CM

## 2021-06-07 DIAGNOSIS — R1013 Epigastric pain: Secondary | ICD-10-CM

## 2021-06-07 MED ORDER — PANTOPRAZOLE SODIUM 40 MG PO TBEC: 40.0000 mg | DELAYED_RELEASE_TABLET | Freq: Two times a day (BID) | ORAL | 4 refills | Status: DC

## 2021-06-07 NOTE — Progress Notes (Signed)
Rx sent per Dr. Reece Agar ?

## 2021-11-30 ENCOUNTER — Emergency Department (HOSPITAL_BASED_OUTPATIENT_CLINIC_OR_DEPARTMENT_OTHER)
Admission: EM | Admit: 2021-11-30 | Discharge: 2021-11-30 | Discharge status: Against Medical Advice | Payer: BC Managed Care – PPO

## 2021-12-18 DIAGNOSIS — Z1231 Encounter for screening mammogram for malignant neoplasm of breast: Secondary | ICD-10-CM | POA: Diagnosis not present

## 2022-01-01 DIAGNOSIS — R928 Other abnormal and inconclusive findings on diagnostic imaging of breast: Secondary | ICD-10-CM | POA: Diagnosis not present

## 2022-01-01 DIAGNOSIS — N6002 Solitary cyst of left breast: Secondary | ICD-10-CM | POA: Diagnosis not present

## 2022-01-08 DIAGNOSIS — Z0000 Encounter for general adult medical examination without abnormal findings: Secondary | ICD-10-CM | POA: Diagnosis not present

## 2022-01-08 DIAGNOSIS — E038 Other specified hypothyroidism: Secondary | ICD-10-CM | POA: Diagnosis not present

## 2022-01-08 DIAGNOSIS — Z79899 Other long term (current) drug therapy: Secondary | ICD-10-CM | POA: Diagnosis not present

## 2022-01-08 DIAGNOSIS — I10 Essential (primary) hypertension: Secondary | ICD-10-CM | POA: Diagnosis not present

## 2022-01-08 DIAGNOSIS — L500 Allergic urticaria: Secondary | ICD-10-CM | POA: Diagnosis not present

## 2022-01-10 LAB — LAB REPORT - SCANNED
A1c: 5.7
EGFR: 102

## 2022-05-19 ENCOUNTER — Encounter: Payer: BC Managed Care – PPO | Admitting: Internal Medicine

## 2022-05-28 ENCOUNTER — Ambulatory Visit (INDEPENDENT_AMBULATORY_CARE_PROVIDER_SITE_OTHER): Payer: BC Managed Care – PPO

## 2022-05-28 ENCOUNTER — Ambulatory Visit: Payer: BC Managed Care – PPO | Attending: Internal Medicine | Admitting: Internal Medicine

## 2022-05-28 ENCOUNTER — Ambulatory Visit: Payer: BC Managed Care – PPO

## 2022-05-28 ENCOUNTER — Encounter: Payer: Self-pay | Admitting: Internal Medicine

## 2022-05-28 VITALS — BP 108/75 | HR 76 | Resp 12 | Ht 65.0 in | Wt 182.0 lb

## 2022-05-28 DIAGNOSIS — M79641 Pain in right hand: Secondary | ICD-10-CM

## 2022-05-28 DIAGNOSIS — M1711 Unilateral primary osteoarthritis, right knee: Secondary | ICD-10-CM

## 2022-05-28 DIAGNOSIS — M1712 Unilateral primary osteoarthritis, left knee: Secondary | ICD-10-CM | POA: Diagnosis not present

## 2022-05-28 DIAGNOSIS — M170 Bilateral primary osteoarthritis of knee: Secondary | ICD-10-CM | POA: Diagnosis not present

## 2022-05-28 DIAGNOSIS — M1710 Unilateral primary osteoarthritis, unspecified knee: Secondary | ICD-10-CM

## 2022-05-28 DIAGNOSIS — M79642 Pain in left hand: Secondary | ICD-10-CM

## 2022-05-28 DIAGNOSIS — R768 Other specified abnormal immunological findings in serum: Secondary | ICD-10-CM | POA: Diagnosis not present

## 2022-05-28 DIAGNOSIS — M545 Low back pain, unspecified: Secondary | ICD-10-CM | POA: Diagnosis not present

## 2022-05-28 DIAGNOSIS — M059 Rheumatoid arthritis with rheumatoid factor, unspecified: Secondary | ICD-10-CM | POA: Insufficient documentation

## 2022-05-28 DIAGNOSIS — G8929 Other chronic pain: Secondary | ICD-10-CM

## 2022-05-28 NOTE — Progress Notes (Signed)
Office Visit Note  Patient: Claire Sullivan             Date of Birth: May 20, 1965           MRN: [id number]             PCP: Bonnita Nasuti, MD Referring: Bonnita Nasuti, MD Visit Date: 05/28/2022   Subjective:  New Patient (Initial Visit) (Patient states she has pain in both knees. Patient states when she sits down on the floor she can not stand up without help.)   History of Present Illness: Aslyn Araki is a 57 y.o. female here for evaluation of joint pain concern for possible rheumatoid arthritis with highly elevated rheumatoid factor.  She has pain affecting both knees most noticeable when she is trying to get up from on the floor has significant difficulty.  She had previous evaluation by orthopedic surgery in 2015 with x-rays showing degenerative arthritis on both sides.  She takes Tylenol as needed but more recently uses equate which is more beneficial.  Does not notice any visible swelling throughout her knees or legs.  She also has chronic low back pain ongoing for years.  For about the past 1 year started having pain and stiffness affecting both hands.  This is localized at the PIP joints.  She does not notice any time of day or activity that seem to improve or worsen this.  Does not have visible swelling or discoloration.  She does have trouble with tightly gripping and drops items and utensils occasionally. She also notices bunions on both feet since about 1 year. She has morning stiffness usually 30 minutes or less daily. No new rashes. No vision changes or eye inflammation.  Labs reviewed 12/2021 RF 283.4 ESR 14 Uric acid 2.8  Activities of Daily Living:  Patient reports morning stiffness for 30 minutes.   Patient Reports nocturnal pain.  Difficulty dressing/grooming: Denies Difficulty climbing stairs: Denies Difficulty getting out of chair: Reports Difficulty using hands for taps, buttons, cutlery, and/or writing: Denies  Review of Systems  Constitutional:  Positive for  fatigue.  HENT:  Negative for mouth sores and mouth dryness.   Eyes:  Negative for dryness.  Respiratory:  Negative for shortness of breath.   Cardiovascular:  Negative for chest pain and palpitations.  Gastrointestinal:  Negative for blood in stool, constipation and diarrhea.  Endocrine: Negative for increased urination.  Genitourinary:  Negative for involuntary urination.  Musculoskeletal:  Positive for joint pain, joint pain, myalgias, muscle weakness, morning stiffness, muscle tenderness and myalgias. Negative for gait problem and joint swelling.  Skin:  Negative for color change, rash, hair loss and sensitivity to sunlight.  Allergic/Immunologic: Positive for susceptible to infections.  Neurological:  Negative for dizziness and headaches.  Hematological:  Negative for swollen glands.  Psychiatric/Behavioral:  Positive for depressed mood. Negative for sleep disturbance. The patient is not nervous/anxious.     PMFS History:  Patient Active Problem List   Diagnosis Date Noted   Rheumatoid factor positive 05/28/2022   Low back pain 05/28/2022   Bilateral hand pain 05/28/2022   Acquired hypothyroidism 08/15/2014   Localized swelling of both lower extremities 08/15/2014   Primary osteoarthritis of left knee 10/17/2013   Primary osteoarthritis of right knee 10/17/2013    Past Medical History:  Diagnosis Date   Anxiety    Hypothyroidism     Family History  Problem Relation Age of Onset   CVA Mother    Kidney disease Father  Colon cancer Neg Hx    Esophageal cancer Neg Hx    Stomach cancer Neg Hx    Past Surgical History:  Procedure Laterality Date   LAPAROSCOPIC HYSTERECTOMY     THYROIDECTOMY, PARTIAL     Social History   Social History Narrative   Not on file   Immunization History  Administered Date(s) Administered   Moderna Sars-Covid-2 Vaccination 06/16/2019, 07/14/2019     Objective: Vital Signs: BP 108/75 (BP Location: Right Arm, Patient Position: Sitting,  Cuff Size: Normal)   Pulse 76   Resp 12   Ht 5\' 5"  (1.651 m)   Wt 182 lb (82.6 kg)   BMI 30.29 kg/m    Physical Exam Eyes:     Conjunctiva/sclera: Conjunctivae normal.  Cardiovascular:     Rate and Rhythm: Normal rate and regular rhythm.  Pulmonary:     Effort: Pulmonary effort is normal.     Breath sounds: Normal breath sounds.  Musculoskeletal:     Right lower leg: No edema.     Left lower leg: No edema.  Lymphadenopathy:     Cervical: No cervical adenopathy.  Skin:    General: Skin is warm and dry.     Findings: No rash.  Neurological:     Mental Status: She is alert.  Psychiatric:        Mood and Affect: Mood normal.      Musculoskeletal Exam:  Shoulders full ROM no tenderness or swelling Elbows full ROM no tenderness or swelling Wrists full ROM no tenderness or swelling Fingers full ROM no tenderness or swelling Scoliosis with apparent rightward deviation in lumbar and lower thoracic spine Knees full ROM no tenderness or swelling patellofemoral crepitus present Ankles full ROM no tenderness or swelling 1st MTP bunions bilaterally with mild lateral deviation, other MTPs normal   Investigation: No additional findings.  Imaging: No results found.  Recent Labs: Lab Results  Component Value Date   WBC 12.7 (H) 06/28/2020   HGB 13.3 06/28/2020   PLT 297 06/28/2020   NA 141 06/28/2020   K 3.5 06/28/2020   CL 106 06/28/2020   CO2 28 06/28/2020   GLUCOSE 128 (H) 06/28/2020   BUN 14 06/28/2020   CREATININE 0.71 06/28/2020   BILITOT 0.2 (L) 06/28/2020   ALKPHOS 88 06/28/2020   AST 34 06/28/2020   ALT 43 06/28/2020   PROT 6.3 (L) 06/28/2020   ALBUMIN 3.8 06/28/2020   CALCIUM 9.3 06/28/2020   GFRAA >60 09/12/2018    Speciality Comments: No specialty comments available.  Procedures:  No procedures performed Allergies: Patient has no known allergies.   Assessment / Plan:     Visit Diagnoses: Rheumatoid factor positive - Plan: Cyclic citrul peptide  antibody, IgG, Sedimentation rate, C-reactive protein, CBC with Differential/Platelet, COMPLETE METABOLIC PANEL WITH GFR  Positive rheumatoid factor and chronic joint pain in multiple areas but no peripheral synovitis appreciable on examination.  Will check sed rate and CRP for inflammatory disease activity assessment.  Also checking CCP will be helpful to confirm the more specific serology.  Checking CBC and CMP as baseline for medication monitoring whether needing to start DMARD or initial conservative treatment with NSAIDs.  Primary osteoarthritis of right knee Primary osteoarthritis of left knee - Plan: XR KNEE 3 VIEW RIGHT, XR KNEE 3 VIEW LEFT  Bilateral knee pain there are no effusions there is patellofemoral crepitus present although range of motion appears well-preserved.  Bilateral knee x-rays show tricompartmental osteoarthritis the patellofemoral compartment symptoms may be affecting  the described issue with getting up from floor position.  Chronic bilateral low back pain without sciatica - Plan: XR Lumbar Spine 2-3 Views  Back pain symptoms consistent with probable degenerative arthritis she also has some mild rotation or scoliosis in the back on exam.  X-ray shows some degenerative disease also with lateral spondylolisthesis.  No evidence of erosive disease in the visualized portion of SI joints.  Bilateral hand pain - Plan: XR Hand 2 View Right, XR Hand 2 View Left  Checking x-rays of bilateral hands today there is some first CMC joint and distal interphalangeal joint degenerative changes.  Could consider ultrasound examination for subclinical synovitis or hyperemia on follow up exam.  Orders: Orders Placed This Encounter  Procedures   XR Hand 2 View Right   XR Hand 2 View Left   XR KNEE 3 VIEW RIGHT   XR KNEE 3 VIEW LEFT   XR Lumbar Spine 2-3 Views   Cyclic citrul peptide antibody, IgG   Sedimentation rate   C-reactive protein   CBC with Differential/Platelet   COMPLETE  METABOLIC PANEL WITH GFR   No orders of the defined types were placed in this encounter.    Follow-Up Instructions: Return in about 2 weeks (around 06/11/2022) for New pt ?RA f/u 2wks.   Fuller Plan, MD  Note - This record has been created using [employer].  Chart creation errors have been sought, but may not always  have been located. Such creation errors do not reflect on  the standard of medical care.

## 2022-05-31 LAB — CBC WITH DIFFERENTIAL/PLATELET
Absolute Monocytes: 587 cells/uL (ref 200–950)
Basophils Absolute: 53 cells/uL (ref 0–200)
Basophils Relative: 0.8 %
Eosinophils Absolute: 356 cells/uL (ref 15–500)
Eosinophils Relative: 5.4 %
HCT: 42.9 % (ref 35.0–45.0)
Hemoglobin: 13.9 g/dL (ref 11.7–15.5)
Lymphs Abs: 2600.4 cells/uL (ref 850–3900)
MCH: 27.8 pg (ref 27.0–33.0)
MCHC: 32.4 g/dL (ref 32.0–36.0)
MCV: 85.8 fL (ref 80.0–100.0)
MPV: 9.6 fL (ref 7.5–12.5)
Monocytes Relative: 8.9 %
Neutro Abs: 3003 cells/uL (ref 1500–7800)
Neutrophils Relative %: 45.5 %
Platelets: 357 10*3/uL (ref 140–400)
RBC: 5 10*6/uL (ref 3.80–5.10)
RDW: 12.3 % (ref 11.0–15.0)
Total Lymphocyte: 39.4 %
WBC: 6.6 10*3/uL (ref 3.8–10.8)

## 2022-05-31 LAB — COMPLETE METABOLIC PANEL WITH GFR
AG Ratio: 1.5 (calc) (ref 1.0–2.5)
ALT: 26 U/L (ref 6–29)
AST: 22 U/L (ref 10–35)
Albumin: 4.4 g/dL (ref 3.6–5.1)
Alkaline phosphatase (APISO): 103 U/L (ref 37–153)
BUN: 18 mg/dL (ref 7–25)
CO2: 30 mmol/L (ref 20–32)
Calcium: 9.9 mg/dL (ref 8.6–10.4)
Chloride: 105 mmol/L (ref 98–110)
Creat: 0.68 mg/dL (ref 0.50–1.03)
Globulin: 2.9 g/dL (calc) (ref 1.9–3.7)
Glucose, Bld: 86 mg/dL (ref 65–99)
Potassium: 4.2 mmol/L (ref 3.5–5.3)
Sodium: 143 mmol/L (ref 135–146)
Total Bilirubin: 0.3 mg/dL (ref 0.2–1.2)
Total Protein: 7.3 g/dL (ref 6.1–8.1)
eGFR: 102 mL/min/{1.73_m2} (ref >=60)

## 2022-05-31 LAB — CYCLIC CITRUL PEPTIDE ANTIBODY, IGG: Cyclic Citrullin Peptide Ab: <16 UNITS

## 2022-05-31 LAB — SEDIMENTATION RATE: Sed Rate: 11 mm/h (ref 0–30)

## 2022-05-31 LAB — C-REACTIVE PROTEIN: CRP: 2.9 mg/L (ref <8.0)

## 2022-06-18 ENCOUNTER — Ambulatory Visit: Payer: BC Managed Care – PPO | Attending: Internal Medicine | Admitting: Internal Medicine

## 2022-06-18 ENCOUNTER — Encounter: Payer: Self-pay | Admitting: Internal Medicine

## 2022-06-18 VITALS — BP 115/72 | HR 72 | Resp 12 | Ht 65.0 in | Wt 180.0 lb

## 2022-06-18 DIAGNOSIS — M79641 Pain in right hand: Secondary | ICD-10-CM | POA: Diagnosis not present

## 2022-06-18 DIAGNOSIS — M1711 Unilateral primary osteoarthritis, right knee: Secondary | ICD-10-CM | POA: Diagnosis not present

## 2022-06-18 DIAGNOSIS — M1712 Unilateral primary osteoarthritis, left knee: Secondary | ICD-10-CM

## 2022-06-18 DIAGNOSIS — M79642 Pain in left hand: Secondary | ICD-10-CM

## 2022-06-18 DIAGNOSIS — R768 Other specified abnormal immunological findings in serum: Secondary | ICD-10-CM | POA: Diagnosis not present

## 2022-06-18 DIAGNOSIS — M170 Bilateral primary osteoarthritis of knee: Secondary | ICD-10-CM

## 2022-06-18 MED ORDER — CELECOXIB 100 MG PO CAPS
100.0000 mg | ORAL_CAPSULE | Freq: Two times a day (BID) | ORAL | 2 refills | Status: AC | PRN
Start: 2022-06-18 — End: ?

## 2022-06-18 NOTE — Progress Notes (Signed)
Office Visit Note  Patient: Claire Sullivan             Date of Birth: Jul 17, 1965           MRN: [id number]             PCP: Galvin Proffer, MD Referring: Galvin Proffer, MD Visit Date: 06/18/2022   Subjective:  Follow-up (Patient states she has pain in both feet, especially below her big toes. Patient states her feet get very cold. )   History of Present Illness: Claire Sullivan is a 57 y.o. female here for follow up for chronic joint pain in multiple areas with a highly positive rheumatoid factor.  Workup at our initial visit was negative for any elevation in serum inflammatory markers and x-rays showing generalized primary osteoarthritis changes.  Besides the continued joint pains as seen before he is also noticing some increased pain at the big toes of her feet and frequently getting cold sensation in both feet.  Previous HPI 05/28/22 Claire Sullivan is a 57 y.o. female here for evaluation of joint pain concern for possible rheumatoid arthritis with highly elevated rheumatoid factor.  She has pain affecting both knees most noticeable when she is trying to get up from on the floor has significant difficulty.  She had previous evaluation by orthopedic surgery in 2015 with x-rays showing degenerative arthritis on both sides.  She takes Tylenol as needed but more recently uses equate which is more beneficial.  Does not notice any visible swelling throughout her knees or legs.  She also has chronic low back pain ongoing for years.  For about the past 1 year started having pain and stiffness affecting both hands.  This is localized at the PIP joints.  She does not notice any time of day or activity that seem to improve or worsen this.  Does not have visible swelling or discoloration.  She does have trouble with tightly gripping and drops items and utensils occasionally. She also notices bunions on both feet since about 1 year. She has morning stiffness usually 30 minutes or less daily. No new rashes. No  vision changes or eye inflammation.   Labs reviewed 12/2021 RF 283.4 ESR 14 Uric acid 2.8   Review of Systems  Constitutional:  Positive for fatigue.  HENT:  Negative for mouth sores and mouth dryness.   Eyes:  Negative for dryness.  Respiratory:  Negative for shortness of breath.   Cardiovascular:  Negative for chest pain and palpitations.  Gastrointestinal:  Negative for blood in stool, constipation and diarrhea.  Endocrine: Negative for increased urination.  Genitourinary:  Negative for involuntary urination.  Musculoskeletal:  Positive for joint pain, joint pain, myalgias, muscle weakness, morning stiffness and myalgias. Negative for gait problem, joint swelling and muscle tenderness.  Skin:  Negative for color change, rash, hair loss and sensitivity to sunlight.  Allergic/Immunologic: Positive for susceptible to infections.  Neurological:  Negative for dizziness and headaches.  Hematological:  Negative for swollen glands.  Psychiatric/Behavioral:  Positive for depressed mood. Negative for sleep disturbance. The patient is not nervous/anxious.     PMFS History:  Patient Active Problem List   Diagnosis Date Noted   Rheumatoid factor positive 05/28/2022   Low back pain 05/28/2022   Bilateral hand pain 05/28/2022   Acquired hypothyroidism 08/15/2014   Localized swelling of both lower extremities 08/15/2014   Primary osteoarthritis of left knee 10/17/2013   Primary osteoarthritis of right knee 10/17/2013    Past Medical History:  Diagnosis Date   Anxiety    Hypothyroidism     Family History  Problem Relation Age of Onset   CVA Mother    Kidney disease Father    Colon cancer Neg Hx    Esophageal cancer Neg Hx    Stomach cancer Neg Hx    Past Surgical History:  Procedure Laterality Date   LAPAROSCOPIC HYSTERECTOMY     THYROIDECTOMY, PARTIAL     Social History   Social History Narrative   Not on file   Immunization History  Administered Date(s) Administered    Moderna Sars-Covid-2 Vaccination 06/16/2019, 07/14/2019     Objective: Vital Signs: BP 115/72 (BP Location: Left Arm, Patient Position: Sitting, Cuff Size: Normal)   Pulse 72   Resp 12   Ht 5\' 5"  (1.651 m)   Wt 180 lb (81.6 kg)   BMI 29.95 kg/m    Physical Exam Eyes:     Conjunctiva/sclera: Conjunctivae normal.  Cardiovascular:     Rate and Rhythm: Normal rate and regular rhythm.  Pulmonary:     Effort: Pulmonary effort is normal.     Breath sounds: Normal breath sounds.  Lymphadenopathy:     Cervical: No cervical adenopathy.  Skin:    General: Skin is warm and dry.     Findings: No rash.  Neurological:     Mental Status: She is alert.  Psychiatric:        Mood and Affect: Mood normal.      Musculoskeletal Exam:  Elbows full ROM no tenderness or swelling Wrists full ROM no tenderness or swelling Fingers full ROM no tenderness or swelling No paraspinal tenderness to palpation Knees full ROM no tenderness or swelling, bilateral patellofemoral crepitus present Ankles full ROM no tenderness or swelling First MTP bunions on both feet with mild deviation, some tenderness to pressure more on medial and plantar surface no appreciable swelling, no other MTP squeeze tenderness  Investigation: No additional findings.  Imaging: No results found.  Recent Labs: Lab Results  Component Value Date   WBC 6.6 05/28/2022   HGB 13.9 05/28/2022   PLT 357 05/28/2022   NA 143 05/28/2022   K 4.2 05/28/2022   CL 105 05/28/2022   CO2 30 05/28/2022   GLUCOSE 86 05/28/2022   BUN 18 05/28/2022   CREATININE 0.68 05/28/2022   BILITOT 0.3 05/28/2022   ALKPHOS 88 06/28/2020   AST 22 05/28/2022   ALT 26 05/28/2022   PROT 7.3 05/28/2022   ALBUMIN 3.8 06/28/2020   CALCIUM 9.9 05/28/2022   GFRAA >60 09/12/2018    Speciality Comments: No specialty comments available.  Procedures:  No procedures performed Allergies: Patient has no known allergies.   Assessment / Plan:      Visit Diagnoses: Bilateral hand pain Primary osteoarthritis of left knee Primary osteoarthritis of right knee - Plan: celecoxib (CELEBREX) 100 MG capsule  Findings of generalized primary osteoarthritis at this time seems to be the primary contributor to pain and stiffness issues.  Discussed supportive treatment options such as supplements.  NSAID use high risk due to previous history of gastritis on treatment with pantoprazole.  Recommend trial of Celebrex 100 mg twice daily as needed see if this is more tolerable with the selective Cox 2 inhibitor.  Rheumatoid factor positive  Highly positive antibody titers so we will definitely follow-up to monitor clinically.  Addition of low-dose selective NSAIDs for now. If more definite inflammatory activity develops could add DMARD treatment.  Orders: No orders of the defined types  were placed in this encounter.  Meds ordered this encounter  Medications   celecoxib (CELEBREX) 100 MG capsule    Sig: Take 1 capsule (100 mg total) by mouth 2 (two) times daily as needed.    Dispense:  60 capsule    Refill:  2     Follow-Up Instructions: Return in about 3 months (around 09/17/2022) for OA/?RA NSAIDs f/u 3mos.   Fuller Plan, MD  Note - This record has been created using [employer].  Chart creation errors have been sought, but may not always  have been located. Such creation errors do not reflect on  the standard of medical care.

## 2022-06-18 NOTE — Patient Instructions (Signed)

## 2022-07-18 ENCOUNTER — Other Ambulatory Visit: Payer: Self-pay | Admitting: Gastroenterology

## 2022-07-18 DIAGNOSIS — R1084 Generalized abdominal pain: Secondary | ICD-10-CM

## 2022-07-18 DIAGNOSIS — R1013 Epigastric pain: Secondary | ICD-10-CM

## 2022-09-15 NOTE — Progress Notes (Signed)
Office Visit Note  Patient: Claire Sullivan             Date of Birth: 06/27/1965           MRN: [id number]             PCP: Galvin Proffer, MD Referring: Galvin Proffer, MD Visit Date: 09/17/2022   Subjective:  Follow-up (Bil foot pain)   History of Present Illness: Arrayah Connors is a 57 y.o. female here for follow up for chronic joint pain in multiple areas with a highly positive rheumatoid factor.  Since last visit she only took the Celebrex a few times she is very concerned about side effects with NSAID use particular because her father saw for renal failure and hemodialysis.  She started taking a mushroom coffee supplement feels this is helping her overall joint inflammation.  Worst problem area is in the big toe of both feet she gets redness and swelling and pain this gets worse after prolonged walking.  Previously was able to walk for 3 miles uninterrupted now gets activity limiting pain after 1 mile.  Previous HPI 06/18/2022 Lileigh Fahringer is a 57 y.o. female here for follow up for chronic joint pain in multiple areas with a highly positive rheumatoid factor.  Workup at our initial visit was negative for any elevation in serum inflammatory markers and x-rays showing generalized primary osteoarthritis changes.  Besides the continued joint pains as seen before he is also noticing some increased pain at the big toes of her feet and frequently getting cold sensation in both feet.   Previous HPI 05/28/22 Mirjana Tarleton is a 57 y.o. female here for evaluation of joint pain concern for possible rheumatoid arthritis with highly elevated rheumatoid factor.  She has pain affecting both knees most noticeable when she is trying to get up from on the floor has significant difficulty.  She had previous evaluation by orthopedic surgery in 2015 with x-rays showing degenerative arthritis on both sides.  She takes Tylenol as needed but more recently uses equate which is more beneficial.  Does not notice any  visible swelling throughout her knees or legs.  She also has chronic low back pain ongoing for years.  For about the past 1 year started having pain and stiffness affecting both hands.  This is localized at the PIP joints.  She does not notice any time of day or activity that seem to improve or worsen this.  Does not have visible swelling or discoloration.  She does have trouble with tightly gripping and drops items and utensils occasionally. She also notices bunions on both feet since about 1 year. She has morning stiffness usually 30 minutes or less daily. No new rashes. No vision changes or eye inflammation.   Labs reviewed 12/2021 RF 283.4 ESR 14 Uric acid 2.8   Review of Systems  Constitutional:  Negative for fatigue.  HENT:  Negative for mouth sores and mouth dryness.   Eyes:  Negative for dryness.  Respiratory:  Negative for shortness of breath.   Cardiovascular:  Negative for chest pain and palpitations.  Gastrointestinal:  Negative for blood in stool, constipation and diarrhea.  Endocrine: Negative for increased urination.  Genitourinary:  Negative for involuntary urination.  Musculoskeletal:  Positive for joint pain and joint pain. Negative for gait problem, joint swelling, myalgias, muscle weakness, morning stiffness, muscle tenderness and myalgias.  Skin:  Negative for color change, rash, hair loss and sensitivity to sunlight.  Allergic/Immunologic: Negative for susceptible to infections.  Neurological:  Negative for dizziness and headaches.  Hematological:  Negative for swollen glands.  Psychiatric/Behavioral:  Negative for depressed mood and sleep disturbance. The patient is not nervous/anxious.     PMFS History:  Patient Active Problem List   Diagnosis Date Noted   Rheumatoid factor positive 05/28/2022   Low back pain 05/28/2022   Bilateral hand pain 05/28/2022   Acquired hypothyroidism 08/15/2014   Localized swelling of both lower extremities 08/15/2014   Primary  osteoarthritis of left knee 10/17/2013   Primary osteoarthritis of right knee 10/17/2013    Past Medical History:  Diagnosis Date   Anxiety    Hypothyroidism     Family History  Problem Relation Age of Onset   CVA Mother    Kidney disease Father    Colon cancer Neg Hx    Esophageal cancer Neg Hx    Stomach cancer Neg Hx    Past Surgical History:  Procedure Laterality Date   LAPAROSCOPIC HYSTERECTOMY     THYROIDECTOMY, PARTIAL     Social History   Social History Narrative   Not on file   Immunization History  Administered Date(s) Administered   Moderna Sars-Covid-2 Vaccination 06/16/2019, 07/14/2019     Objective: Vital Signs: BP 107/71 (BP Location: Left Arm, Patient Position: Sitting, Cuff Size: Normal)   Pulse 67   Resp 15   Ht 5\' 5"  (1.651 m)   Wt 179 lb (81.2 kg)   BMI 29.79 kg/m    Physical Exam Eyes:     Conjunctiva/sclera: Conjunctivae normal.  Cardiovascular:     Rate and Rhythm: Normal rate and regular rhythm.  Pulmonary:     Effort: Pulmonary effort is normal.     Breath sounds: Normal breath sounds.  Musculoskeletal:     Right lower leg: No edema.     Left lower leg: No edema.  Lymphadenopathy:     Cervical: No cervical adenopathy.  Skin:    General: Skin is warm and dry.     Findings: No rash.  Neurological:     Mental Status: She is alert.  Psychiatric:        Mood and Affect: Mood normal.      Musculoskeletal Exam:  Shoulders full ROM no tenderness or swelling Elbows full ROM no tenderness or swelling Wrists full ROM no tenderness or swelling Fingers full ROM no tenderness or swelling Knees full ROM no tenderness or swelling Ankles full ROM no tenderness or swelling, patellofemoral crepitus present 1st MTP bunions with mild lateral deviation, other MTP joints appear normal  Investigation: No additional findings.  Imaging: No results found.  Recent Labs: Lab Results  Component Value Date   WBC 6.6 05/28/2022   HGB 13.9  05/28/2022   PLT 357 05/28/2022   NA 143 05/28/2022   K 4.2 05/28/2022   CL 105 05/28/2022   CO2 30 05/28/2022   GLUCOSE 86 05/28/2022   BUN 18 05/28/2022   CREATININE 0.68 05/28/2022   BILITOT 0.3 05/28/2022   ALKPHOS 88 06/28/2020   AST 22 05/28/2022   ALT 26 05/28/2022   PROT 7.3 05/28/2022   ALBUMIN 3.8 06/28/2020   CALCIUM 9.9 05/28/2022   GFRAA >60 09/12/2018    Speciality Comments: No specialty comments available.  Procedures:  No procedures performed Allergies: Patient has no known allergies.   Assessment / Plan:     Visit Diagnoses: Rheumatoid factor positive - Highly positive antibody titers - Plan: Sedimentation rate, C-reactive protein  High positive rheumatoid factor and multiple joint pains  but not very clear clinically.  Still no peripheral synovitis appreciable on exam today. Will check sed rate and CRP.  If still normal would just continue supportive treatment with NSAID medications as needed.  Bilateral hand pain - Recommend trial of Celebrex 100 mg twice daily as needed see if this is more tolerable with the selective Cox 2 inhibitor.  Has some intolerance when taking daily NSAIDs and also has not taken Celebrex many times since our last visit.  Bilateral bunions - Plan: Ambulatory referral to Podiatry  Discussed bunions a separate problem unrelated to possible rheumatoid arthritis there is no appreciable inflammation on exam.  I do not believe these are currently severely impacting her biomechanics or adjacent digits.  Will refer to podiatry to discuss her options.  Orders: Orders Placed This Encounter  Procedures   Sedimentation rate   C-reactive protein   Ambulatory referral to Podiatry   No orders of the defined types were placed in this encounter.    Follow-Up Instructions: Return in about 6 months (around 03/20/2023) for RA on celebrex f/u 6mos.   Fuller Plan, MD  Note - This record has been created using [employer].  Chart  creation errors have been sought, but may not always  have been located. Such creation errors do not reflect on  the standard of medical care.

## 2022-09-17 ENCOUNTER — Ambulatory Visit: Payer: BC Managed Care – PPO | Attending: Internal Medicine | Admitting: Internal Medicine

## 2022-09-17 ENCOUNTER — Encounter: Payer: Self-pay | Admitting: Internal Medicine

## 2022-09-17 VITALS — BP 107/71 | HR 67 | Resp 15 | Ht 65.0 in | Wt 179.0 lb

## 2022-09-17 DIAGNOSIS — M21612 Bunion of left foot: Secondary | ICD-10-CM

## 2022-09-17 DIAGNOSIS — M1711 Unilateral primary osteoarthritis, right knee: Secondary | ICD-10-CM | POA: Diagnosis not present

## 2022-09-17 DIAGNOSIS — M1712 Unilateral primary osteoarthritis, left knee: Secondary | ICD-10-CM

## 2022-09-17 DIAGNOSIS — M79641 Pain in right hand: Secondary | ICD-10-CM | POA: Diagnosis not present

## 2022-09-17 DIAGNOSIS — M170 Bilateral primary osteoarthritis of knee: Secondary | ICD-10-CM

## 2022-09-17 DIAGNOSIS — M79642 Pain in left hand: Secondary | ICD-10-CM

## 2022-09-17 DIAGNOSIS — R768 Other specified abnormal immunological findings in serum: Secondary | ICD-10-CM

## 2022-09-17 DIAGNOSIS — M21611 Bunion of right foot: Secondary | ICD-10-CM

## 2022-09-18 LAB — C-REACTIVE PROTEIN: CRP: 4.5 mg/L (ref <8.0)

## 2022-09-18 LAB — SEDIMENTATION RATE: Sed Rate: 14 mm/h (ref 0–30)

## 2022-10-14 ENCOUNTER — Ambulatory Visit: Payer: BC Managed Care – PPO | Admitting: Podiatry

## 2022-10-14 ENCOUNTER — Encounter: Payer: Self-pay | Admitting: Podiatry

## 2022-10-14 ENCOUNTER — Ambulatory Visit (INDEPENDENT_AMBULATORY_CARE_PROVIDER_SITE_OTHER): Payer: BC Managed Care – PPO

## 2022-10-14 DIAGNOSIS — M19071 Primary osteoarthritis, right ankle and foot: Secondary | ICD-10-CM

## 2022-10-14 DIAGNOSIS — M19072 Primary osteoarthritis, left ankle and foot: Secondary | ICD-10-CM

## 2022-10-14 DIAGNOSIS — M2010 Hallux valgus (acquired), unspecified foot: Secondary | ICD-10-CM | POA: Diagnosis not present

## 2022-10-14 DIAGNOSIS — M2012 Hallux valgus (acquired), left foot: Secondary | ICD-10-CM | POA: Diagnosis not present

## 2022-10-14 DIAGNOSIS — M2011 Hallux valgus (acquired), right foot: Secondary | ICD-10-CM

## 2022-10-14 MED ORDER — TRIAMCINOLONE ACETONIDE 40 MG/ML IJ SUSP
40.0000 mg | Freq: Once | INTRAMUSCULAR | Status: AC
Start: 2022-10-14 — End: 2022-10-14
  Administered 2022-10-14: 40 mg

## 2022-10-14 NOTE — Progress Notes (Signed)
Subjective:  Patient ID: Claire Sullivan, female    DOB: September 14, 1965,  MRN: [id number] HPI Chief Complaint  Patient presents with   Foot Pain    1st MPJ bilateral - bunion deformity x years, starting to ache a lot, walks for exercise and is unable to do that now   New Patient (Initial Visit)    57 y.o. female presents with the above complaint.   ROS: Denies fever chills nausea vomit muscle aches pains calf pain back pain chest pain shortness of breath.  Past Medical History:  Diagnosis Date   Anxiety    Hypothyroidism    Past Surgical History:  Procedure Laterality Date   LAPAROSCOPIC HYSTERECTOMY     THYROIDECTOMY, PARTIAL      Current Outpatient Medications:    ALPRAZolam (XANAX) 1 MG tablet, Take by mouth., Disp: , Rfl:    celecoxib (CELEBREX) 100 MG capsule, Take 1 capsule (100 mg total) by mouth 2 (two) times daily as needed., Disp: 60 capsule, Rfl: 2   hydrochlorothiazide (HYDRODIURIL) 12.5 MG tablet, Take 12.5 mg by mouth daily., Disp: , Rfl:    levothyroxine (SYNTHROID) 150 MCG tablet, Take 150 mcg by mouth., Disp: , Rfl:    pantoprazole (PROTONIX) 40 MG tablet, Take 1 tablet (40 mg total) by mouth 2 (two) times daily. Please call [phone] to schedule an office visit for more refills, Disp: 180 tablet, Rfl: 0  No Known Allergies Review of Systems Objective:  There were no vitals filed for this visit.  General: Well developed, nourished, in no acute distress, alert and oriented x3   Dermatological: Skin is warm, dry and supple bilateral. Nails x 10 are well maintained; remaining integument appears unremarkable at this time. There are no open sores, no preulcerative lesions, no rash or signs of infection present.  Vascular: Dorsalis Pedis artery and Posterior Tibial artery pedal pulses are 2/4 bilateral with immedate capillary fill time. Pedal hair growth present. No varicosities and no lower extremity edema present bilateral.   Neruologic: Grossly intact via light  touch bilateral. Vibratory intact via tuning fork bilateral. Protective threshold with Semmes Wienstein monofilament intact to all pedal sites bilateral. Patellar and Achilles deep tendon reflexes 2+ bilateral. No Babinski or clonus noted bilateral.   Musculoskeletal: No gross boney pedal deformities bilateral. No pain, crepitus, or limitation noted with foot and ankle range of motion bilateral. Muscular strength 5/5 in all groups tested bilateral.  Reducible bunion deformity at the first metatarsal phalangeal joint bilaterally she has tenderness on palpation of the medial dorsal cutaneous nerve particularly of the left foot as opposed to the right.  Tailor's bunion deformities are also noted with soft tissue increase around the plantar and plantar lateral aspect of the head consistent with a bursitis.  No crepitation on range of motion.  Gait: Unassisted, Nonantalgic.    Radiographs:  Radiographs taken today demonstrate an osseously mature individual good bone mineralization.  Tailor's bunion deformities with soft tissue swelling lateral aspect of the foot moving medially demonstrates an increase in the first intermetatarsal angle greater than normal value with a prominent hypertrophic medial condyle to the head of the first metatarsal.  She has early dislocation and obvious valgus rotation of the hallux this is noted bilaterally.  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  Plan: Discussed etiology pathology conservative versus surgical therapies discussed topical therapy to the area.  She currently does not want surgery we did discuss surgery in great detail explaining to her that it could take as long as 3 months for this to heal and she has a 22-year-old grandson that she takes care of at this time.  We offered her an injection  which she compliant with and we consented her for this.  I injected dexamethasone and local anesthetic to the dorsal medial aspect of the first metatarsophalangeal joint right and left.  This is primarily external injection it was not intra-articular injection.     Melvern Ramone T. [place], [place]

## 2022-12-11 DIAGNOSIS — I10 Essential (primary) hypertension: Secondary | ICD-10-CM | POA: Diagnosis not present

## 2022-12-11 DIAGNOSIS — E038 Other specified hypothyroidism: Secondary | ICD-10-CM | POA: Diagnosis not present

## 2022-12-11 DIAGNOSIS — Z0000 Encounter for general adult medical examination without abnormal findings: Secondary | ICD-10-CM | POA: Diagnosis not present

## 2023-01-15 ENCOUNTER — Ambulatory Visit: Payer: BC Managed Care – PPO | Admitting: Podiatry

## 2023-03-06 NOTE — Progress Notes (Signed)
Office Visit Note  Patient: Claire Sullivan             Date of Birth: 05-03-1965           MRN: [id number]             PCP: Galvin Proffer, MD Referring: Galvin Proffer, MD Visit Date: 03/20/2023   Subjective:  Follow-up (Patient states she cannot put weight on her left foot. Patient states wearing shoes on her left foot hurts. Patient states she pain in her left foot is continuous. Patient states she has more arthritis pain all over. Patient states the Celebrex did not help. Patient states she tried acupuncture and its helped a little but not much. )   Discussed the use of AI scribe software for clinical note transcription with the patient, who gave verbal consent to proceed.  History of Present Illness   Claire Sullivan is a 58 y.o. female here for follow up for chronic joint pain in multiple areas with a highly positive rheumatoid factor.  She presents with ongoing pain in the fingers and backside of the legs, which has been worsening over the past three weeks. The pain is more severe on one side and has been so debilitating that it has affected their ability to walk. The patient also reports stiffness in the legs, making walking very painful. They have sought relief from a pharmacist and through acupuncture, but the pain persists.  In addition to the pain, the patient has noticed swelling in one foot, which has been observed to have two different colors. This has raised concerns from their acupuncturist. The patient has also sought help from a foot doctor, who suggested surgery, but the patient is unable to undergo the procedure.  The patient also reports pain and weakness in the hands, which has been present for the past seven to eight months. This has affected their ability to perform tasks such as opening bottles. Despite these symptoms, the patient has not noticed any swelling in the hands.  The patient has tried various medications for pain relief, including Celebrex and Tylenol, but  these have not been effective. They have also received an injection for the foot pain, which only exacerbated the symptoms. The patient has expressed a desire for a quick-acting treatment to alleviate the pain.    Previous HPI 09/17/2022 Claire Sullivan is a 58 y.o. female here for follow up for chronic joint pain in multiple areas with a highly positive rheumatoid factor.  Since last visit she only took the Celebrex a few times she is very concerned about side effects with NSAID use particular because her father saw for renal failure and hemodialysis.  She started taking a mushroom coffee supplement feels this is helping her overall joint inflammation.  Worst problem area is in the big toe of both feet she gets redness and swelling and pain this gets worse after prolonged walking.  Previously was able to walk for 3 miles uninterrupted now gets activity limiting pain after 1 mile.   Previous HPI 06/18/2022 Claire Sullivan is a 58 y.o. female here for follow up for chronic joint pain in multiple areas with a highly positive rheumatoid factor.  Workup at our initial visit was negative for any elevation in serum inflammatory markers and x-rays showing generalized primary osteoarthritis changes.  Besides the continued joint pains as seen before he is also noticing some increased pain at the big toes of her feet and frequently getting cold sensation in both feet.  Previous HPI 05/28/22 Claire Sullivan is a 58 y.o. female here for evaluation of joint pain concern for possible rheumatoid arthritis with highly elevated rheumatoid factor.  She has pain affecting both knees most noticeable when she is trying to get up from on the floor has significant difficulty.  She had previous evaluation by orthopedic surgery in 2015 with x-rays showing degenerative arthritis on both sides.  She takes Tylenol as needed but more recently uses equate which is more beneficial.  Does not notice any visible swelling throughout her knees  or legs.  She also has chronic low back pain ongoing for years.  For about the past 1 year started having pain and stiffness affecting both hands.  This is localized at the PIP joints.  She does not notice any time of day or activity that seem to improve or worsen this.  Does not have visible swelling or discoloration.  She does have trouble with tightly gripping and drops items and utensils occasionally. She also notices bunions on both feet since about 1 year. She has morning stiffness usually 30 minutes or less daily. No new rashes. No vision changes or eye inflammation.   Labs reviewed 12/2021 RF 283.4 ESR 14 Uric acid 2.8   Review of Systems  Constitutional:  Negative for fatigue.  HENT:  Negative for mouth sores and mouth dryness.   Eyes:  Negative for dryness.  Respiratory:  Negative for shortness of breath.   Cardiovascular:  Negative for chest pain and palpitations.  Gastrointestinal:  Negative for blood in stool, constipation and diarrhea.  Endocrine: Negative for increased urination.  Genitourinary:  Negative for involuntary urination.  Musculoskeletal:  Positive for joint pain, joint pain, joint swelling, muscle weakness, morning stiffness and muscle tenderness. Negative for gait problem, myalgias and myalgias.  Skin:  Positive for color change. Negative for rash, hair loss and sensitivity to sunlight.  Allergic/Immunologic: Negative for susceptible to infections.  Neurological:  Negative for dizziness and headaches.  Hematological:  Negative for swollen glands.  Psychiatric/Behavioral:  Positive for depressed mood. Negative for sleep disturbance. The patient is not nervous/anxious.     PMFS History:  Patient Active Problem List   Diagnosis Date Noted   High risk medication use 03/20/2023   Seropositive rheumatoid arthritis (HCC) 05/28/2022   Low back pain 05/28/2022   Bilateral hand pain 05/28/2022   Acquired hypothyroidism 08/15/2014   Localized swelling of both lower  extremities 08/15/2014   Primary osteoarthritis of left knee 10/17/2013   Primary osteoarthritis of right knee 10/17/2013    Past Medical History:  Diagnosis Date   Anxiety    Hypothyroidism     Family History  Problem Relation Age of Onset   CVA Mother    Breast cancer Mother    Kidney disease Father    Colon cancer Neg Hx    Esophageal cancer Neg Hx    Stomach cancer Neg Hx    Past Surgical History:  Procedure Laterality Date   LAPAROSCOPIC HYSTERECTOMY     THYROIDECTOMY, PARTIAL     Social History   Social History Narrative   Not on file   Immunization History  Administered Date(s) Administered   Moderna Sars-Covid-2 Vaccination 06/16/2019, 07/14/2019     Objective: Vital Signs: BP 116/78 (BP Location: Right Arm, Patient Position: Sitting, Cuff Size: Normal)   Pulse 79   Resp 14   Ht 5\' 5"  (1.651 m)   Wt 185 lb (83.9 kg)   BMI 30.79 kg/m    Physical Exam  Eyes:     Conjunctiva/sclera: Conjunctivae normal.  Cardiovascular:     Rate and Rhythm: Normal rate and regular rhythm.  Pulmonary:     Effort: Pulmonary effort is normal.     Breath sounds: Normal breath sounds.  Musculoskeletal:     Right lower leg: No edema.     Left lower leg: No edema.  Lymphadenopathy:     Cervical: No cervical adenopathy.  Skin:    General: Skin is warm and dry.     Findings: No rash.  Neurological:     Mental Status: She is alert.  Psychiatric:        Mood and Affect: Mood normal.      Musculoskeletal Exam:  Shoulders full ROM no tenderness or swelling Elbows full ROM no tenderness or swelling Wrists full ROM no tenderness or swelling Fingers full ROM, left thumb and right MCPs tenderness to pressure, no palpable synovitis Knees full ROM no tenderness or swelling Left dorsal midfoot swelling and tenderness to pressure  Limited ultrasound exam showing soft tissue swelling with color doppler enhancement, no organized fluid collection  Investigation: No additional  findings.  Imaging: No results found.  Recent Labs: Lab Results  Component Value Date   WBC 6.6 05/28/2022   HGB 13.9 05/28/2022   PLT 357 05/28/2022   NA 143 05/28/2022   K 4.2 05/28/2022   CL 105 05/28/2022   CO2 30 05/28/2022   GLUCOSE 86 05/28/2022   BUN 18 05/28/2022   CREATININE 0.68 05/28/2022   BILITOT 0.3 05/28/2022   ALKPHOS 88 06/28/2020   AST 22 05/28/2022   ALT 26 05/28/2022   PROT 7.3 05/28/2022   ALBUMIN 3.8 06/28/2020   CALCIUM 9.9 05/28/2022   GFRAA >60 09/12/2018    Speciality Comments: No specialty comments available.  Procedures:  Small Joint Inj: L intertarsal on 03/20/2023 12:10 PM Details: dorsal approach Medications: 30 mg triamcinolone acetonide 40 MG/ML; 0.75 mL lidocaine 1 % Outcome: tolerated well, no immediate complications Procedure, treatment alternatives, risks and benefits explained, specific risks discussed. Consent was given by the patient. Immediately prior to procedure a time out was called to verify the correct patient, procedure, equipment, support staff and site/side marked as required. Patient was prepped and draped in the usual sterile fashion.     Allergies: Patient has no known allergies.   Assessment / Plan:     Visit Diagnoses: Seropositive rheumatoid arthritis (HCC) - supportive treatment with NSAID medications as needed. - Plan: Sedimentation rate, C-reactive protein, methotrexate (RHEUMATREX) 2.5 MG tablet, folic acid (FOLVITE) 1 MG tablet Persistent pain in fingers and feet with swelling in the foot. Positive rheumatoid factor previously. Recent worsening of symptoms with increased swelling in the foot. Clinically more apparent signs of active joint inflammation, although some OA present. Declines oral steroids, reports previous insomnia as severe side effect. -Start Methotrexate 15mg  PO once weekly. -Start Folic Acid 1mg  daily to prevent potential side effects of Methotrexate. -Administer a steroid injection in the foot  today for immediate relief.  High risk medication use - Celebrex 100 mg twice daily as needed - Plan: CBC with Differential/Platelet, COMPLETE METABOLIC PANEL WITH GFR, Hepatitis B core antibody, IgM, Hepatitis B surface antigen, Hepatitis C antibody -Check complete blood count and liver function tests today. -Advise patient to avoid alcohol due to potential liver toxicity with Methotrexate.   Orders: Orders Placed This Encounter  Procedures   Sedimentation rate   C-reactive protein   CBC with Differential/Platelet   COMPLETE METABOLIC PANEL  WITH GFR   Hepatitis B core antibody, IgM   Hepatitis B surface antigen   Hepatitis C antibody   Meds ordered this encounter  Medications   methotrexate (RHEUMATREX) 2.5 MG tablet    Sig: Take 6 tablets (15 mg total) by mouth once a week. Caution:Chemotherapy. Protect from light.    Dispense:  30 tablet    Refill:  1   folic acid (FOLVITE) 1 MG tablet    Sig: Take 1 tablet (1 mg total) by mouth daily.    Dispense:  90 tablet    Refill:  0     Follow-Up Instructions: Return in about 5 weeks (around 04/24/2023) for RA MTX start/inj f/u 4-6 wks.   Fuller Plan, MD  Note - This record has been created using [employer].  Chart creation errors have been sought, but may not always  have been located. Such creation errors do not reflect on  the standard of medical care.

## 2023-03-20 ENCOUNTER — Ambulatory Visit: Payer: BC Managed Care – PPO | Attending: Internal Medicine | Admitting: Internal Medicine

## 2023-03-20 ENCOUNTER — Encounter: Payer: Self-pay | Admitting: Internal Medicine

## 2023-03-20 VITALS — BP 116/78 | HR 79 | Resp 14 | Ht 65.0 in | Wt 185.0 lb

## 2023-03-20 DIAGNOSIS — M059 Rheumatoid arthritis with rheumatoid factor, unspecified: Secondary | ICD-10-CM | POA: Diagnosis not present

## 2023-03-20 DIAGNOSIS — M79641 Pain in right hand: Secondary | ICD-10-CM | POA: Diagnosis not present

## 2023-03-20 DIAGNOSIS — M21611 Bunion of right foot: Secondary | ICD-10-CM | POA: Diagnosis not present

## 2023-03-20 DIAGNOSIS — M79642 Pain in left hand: Secondary | ICD-10-CM

## 2023-03-20 DIAGNOSIS — M21612 Bunion of left foot: Secondary | ICD-10-CM | POA: Diagnosis not present

## 2023-03-20 DIAGNOSIS — R768 Other specified abnormal immunological findings in serum: Secondary | ICD-10-CM

## 2023-03-20 DIAGNOSIS — Z79899 Other long term (current) drug therapy: Secondary | ICD-10-CM | POA: Diagnosis not present

## 2023-03-20 MED ORDER — LIDOCAINE HCL 1 % IJ SOLN
0.7500 mL | INTRAMUSCULAR | Status: AC | PRN
  Administered 2023-03-20: .75 mL

## 2023-03-20 MED ORDER — FOLIC ACID 1 MG PO TABS: 1.0000 mg | ORAL_TABLET | Freq: Every day | ORAL | 0 refills | Status: DC

## 2023-03-20 MED ORDER — TRIAMCINOLONE ACETONIDE 40 MG/ML IJ SUSP
30.0000 mg | INTRAMUSCULAR | Status: AC | PRN
  Administered 2023-03-20: 30 mg via INTRA_ARTICULAR

## 2023-03-20 MED ORDER — METHOTREXATE SODIUM 2.5 MG PO TABS: 15.0000 mg | ORAL_TABLET | ORAL | 1 refills | Status: DC

## 2023-03-20 NOTE — Patient Instructions (Signed)
Methotrexate Injection What is this medication? METHOTREXATE (METH oh TREX ate) treats inflammatory conditions such as arthritis and psoriasis. It works by decreasing inflammation, which can reduce pain and prevent long-term injury to the joints and skin. It may also be used to treat some types of cancer. It works by slowing down the growth of cancer cells. This medicine may be used for other purposes; ask your health care provider or pharmacist if you have questions. What should I tell my care team before I take this medication? They need to know if you have any of these conditions: Fluid in the stomach area or lungs Frequently drink alcohol Infection or immune system problems Kidney disease Liver disease Low blood counts (white cells, platelets, or red blood cells) Lung disease Recent or ongoing radiation Recent or upcoming vaccine Stomach ulcers Ulcerative colitis An unusual or allergic reaction to methotrexate, other medications, foods, dyes, or preservatives Pregnant or trying to get pregnant Breastfeeding How should I use this medication? This medication is for infusion into a vein or for injection into muscle or into the spinal fluid (whichever applies). It is usually given in a hospital or clinic setting. In rare cases, you might get this medication at home. You will be taught how to give this medication. Use exactly as directed. Take your medication at regular intervals. Do not take your medication more often than directed. If this medication is used for arthritis or psoriasis, it should be taken weekly, NOT daily. It is important that you put your used needles and syringes in a special sharps container. Do not put them in a trash can. If you do not have a sharps container, call your pharmacist or care team to get one. Talk to your care team about the use of this medication in children. While this medication may be prescribed for children as young as 2 years for selected conditions,  precautions do apply. Overdosage: If you think you have taken too much of this medicine contact a poison control center or emergency room at once. NOTE: This medicine is only for you. Do not share this medicine with others. What if I miss a dose? It is important not to miss your dose. Call your care team if you are unable to keep an appointment. If you give yourself the medication, and you miss a dose, talk with your care team. Do not take double or extra doses. What may interact with this medication? Do not take this medication with any of the following: Acitretin Probenecid This medication may also interact with the following: Aspirin or aspirin-like medications Azathioprine Certain antibiotics, such as gentamicin, penicillin, tetracycline, vancomycin Certain medications that treat or prevent blood clots, such as warfarin, apixaban, dabigatran, rivaroxaban Certain medications for stomach problems, such as esomeprazole, omeprazole, pantoprazole Dapsone Hydroxychloroquine Live virus vaccines Medications for viral infections, such as acyclovir, cidofovir, foscarnet, ganciclovir Mercaptopurine NSAIDs, medications for pain and inflammation, such as ibuprofen or naproxen Phenytoin Pyrimethamine Retinoids, such as isotretinoin or tretinoin Sulfonamides, such as sulfasalazine or trimethoprim; sulfamethoxazole Theophylline This list may not describe all possible interactions. Give your health care provider a list of all the medicines, herbs, non-prescription drugs, or dietary supplements you use. Also tell them if you smoke, drink alcohol, or use illegal drugs. Some items may interact with your medicine. What should I watch for while using this medication? This medication may make you feel generally unwell. This is not uncommon as chemotherapy can affect healthy cells as well as cancer cells. Report any side effects.  Continue your course of treatment even though you feel ill unless your care  team tells you to stop. Your condition will be monitored carefully while you are receiving this medication. Avoid alcoholic drinks. This medication can cause serious side effects. To reduce the risk, your care team may give you other medications to take before receiving this one. Be sure to follow the directions from your care team. This medication can make you more sensitive to the sun. Keep out of the sun. If you cannot avoid being in the sun, wear protective clothing and use sunscreen. Do not use sun lamps or tanning beds/booths. You may get drowsy or dizzy. Do not drive, use machinery, or do anything that needs mental alertness until you know how this medication affects you. Do not stand or sit up quickly, especially if you are an older patient. This reduces the risk of dizzy or fainting spells. You may need blood work while you are taking this medication. Call your care team for advice if you get a fever, chills or sore throat, or other symptoms of a cold or flu. Do not treat yourself. This medication decreases your body's ability to fight infections. Try to avoid being around people who are sick. This medication may increase your risk to bruise or bleed. Call your care team if you notice any unusual bleeding. Be careful brushing or flossing your teeth or using a toothpick because you may get an infection or bleed more easily. If you have any dental work done, tell your dentist you are receiving this medication Check with your care team if you get an attack of severe diarrhea, nausea and vomiting, or if you sweat a lot. The loss of too much body fluid can make it dangerous for you to take this medication. Talk to your care team about your risk of cancer. You may be more at risk for certain types of cancers if you take this medication. Do not become pregnant while taking this medication or for 6 months after stopping it. Women should inform their care team if they wish to become pregnant or think  they might be pregnant. Men should not father a child while taking this medication and for 3 months after stopping it. There is potential for serious harm to an unborn child. Talk to your care team for more information. Do not breast-feed an infant while taking this medication or for 1 week after stopping it. This medication may make it more difficult to get pregnant or father a child. Talk to your care team if you are concerned about your fertility. What side effects may I notice from receiving this medication? Side effects that you should report to your care team as soon as possible: Allergic reactions--skin rash, itching, hives, swelling of the face, lips, tongue, or throat Blood clot--pain, swelling, or warmth in the leg, shortness of breath, chest pain Dry cough, shortness of breath or trouble breathing Infection--fever, chills, cough, sore throat, wounds that don't heal, pain or trouble when passing urine, general feeling of discomfort or being unwell Kidney injury--decrease in the amount of urine, swelling of the ankles, hands, or feet Liver injury--right upper belly pain, loss of appetite, nausea, light-colored stool, dark yellow or brown urine, yellowing of the skin or eyes, unusual weakness or fatigue Low red blood cell count--unusual weakness or fatigue, dizziness, headache, trouble breathing Redness, blistering, peeling, or loosening of the skin, including inside the mouth Seizures Unusual bruising or bleeding Side effects that usually do not require medical  attention (report to your care team if they continue or are bothersome): Diarrhea Dizziness Hair loss Nausea Pain, redness, or swelling with sores inside the mouth or throat Vomiting This list may not describe all possible side effects. Call your doctor for medical advice about side effects. You may report side effects to FDA at 1-800-FDA-1088. Where should I keep my medication? This medication is given in a hospital or clinic.  It will not be stored at home. NOTE: This sheet is a summary. It may not cover all possible information. If you have questions about this medicine, talk to your doctor, pharmacist, or health care provider.  2024 Elsevier/Gold Standard (2022-07-16 00:00:00)

## 2023-03-22 LAB — CBC WITH DIFFERENTIAL/PLATELET
Absolute Lymphocytes: 2978 {cells}/uL (ref 850–3900)
Absolute Monocytes: 561 {cells}/uL (ref 200–950)
Basophils Absolute: 24 {cells}/uL (ref 0–200)
Basophils Relative: 0.3 %
Eosinophils Absolute: 679 {cells}/uL — ABNORMAL HIGH (ref 15–500)
Eosinophils Relative: 8.6 %
HCT: 39.7 % (ref 35.0–45.0)
Hemoglobin: 12.9 g/dL (ref 11.7–15.5)
MCH: 27.4 pg (ref 27.0–33.0)
MCHC: 32.5 g/dL (ref 32.0–36.0)
MCV: 84.5 fL (ref 80.0–100.0)
MPV: 9.5 fL (ref 7.5–12.5)
Monocytes Relative: 7.1 %
Neutro Abs: 3658 {cells}/uL (ref 1500–7800)
Neutrophils Relative %: 46.3 %
Platelets: 330 10*3/uL (ref 140–400)
RBC: 4.7 10*6/uL (ref 3.80–5.10)
RDW: 12.1 % (ref 11.0–15.0)
Total Lymphocyte: 37.7 %
WBC: 7.9 10*3/uL (ref 3.8–10.8)

## 2023-03-22 LAB — COMPLETE METABOLIC PANEL WITH GFR
AG Ratio: 1.4 (calc) (ref 1.0–2.5)
ALT: 16 U/L (ref 6–29)
AST: 17 U/L (ref 10–35)
Albumin: 4 g/dL (ref 3.6–5.1)
Alkaline phosphatase (APISO): 110 U/L (ref 37–153)
BUN: 16 mg/dL (ref 7–25)
CO2: 30 mmol/L (ref 20–32)
Calcium: 9.8 mg/dL (ref 8.6–10.4)
Chloride: 104 mmol/L (ref 98–110)
Creat: 0.6 mg/dL (ref 0.50–1.03)
Globulin: 2.8 g/dL (ref 1.9–3.7)
Glucose, Bld: 93 mg/dL (ref 65–99)
Potassium: 4.2 mmol/L (ref 3.5–5.3)
Sodium: 141 mmol/L (ref 135–146)
Total Bilirubin: 0.3 mg/dL (ref 0.2–1.2)
Total Protein: 6.8 g/dL (ref 6.1–8.1)
eGFR: 104 mL/min/{1.73_m2} (ref >=60)

## 2023-03-22 LAB — HEPATITIS B SURFACE ANTIGEN: Hepatitis B Surface Ag: NONREACTIVE

## 2023-03-22 LAB — SEDIMENTATION RATE: Sed Rate: 22 mm/h (ref 0–30)

## 2023-03-22 LAB — HEPATITIS B CORE ANTIBODY, IGM: Hep B C IgM: NONREACTIVE

## 2023-03-22 LAB — HEPATITIS C ANTIBODY: Hepatitis C Ab: NONREACTIVE

## 2023-03-22 LAB — C-REACTIVE PROTEIN: CRP: 4 mg/L (ref <8.0)

## 2023-03-23 NOTE — Progress Notes (Signed)
Sedimentation rate and CRP were normal no increase in systemic inflammation. Her blood count, metabolic panel, and hepatitis screening were all normal so no problems for the methotrexate as planned.

## 2023-04-02 ENCOUNTER — Other Ambulatory Visit: Payer: Self-pay

## 2023-04-02 ENCOUNTER — Other Ambulatory Visit: Payer: Self-pay | Admitting: *Deleted

## 2023-04-02 ENCOUNTER — Telehealth: Payer: Self-pay | Admitting: *Deleted

## 2023-04-02 DIAGNOSIS — Z79899 Other long term (current) drug therapy: Secondary | ICD-10-CM

## 2023-04-02 DIAGNOSIS — E039 Hypothyroidism, unspecified: Secondary | ICD-10-CM

## 2023-04-02 DIAGNOSIS — M059 Rheumatoid arthritis with rheumatoid factor, unspecified: Secondary | ICD-10-CM

## 2023-04-02 NOTE — Telephone Encounter (Signed)
 Patient contacted the office stating she was seen in the office on 03/20/2023. Patient states she has taken 2 doses of the oral MTX. Patient states after both doses she was having dizziness, trouble walking and heart palpitations. Patient would like to know if she should take the oral dose of MTX on Monday or if she should be switched to the injectable. Please advise.

## 2023-04-02 NOTE — Addendum Note (Signed)
 Addended by: Adrianne Horn on: 04/02/2023 03:41 PM   Modules accepted: Orders

## 2023-04-03 LAB — COMPLETE METABOLIC PANEL WITH GFR
AG Ratio: 1.5 (calc) (ref 1.0–2.5)
ALT: 15 U/L (ref 6–29)
AST: 16 U/L (ref 10–35)
Albumin: 4.4 g/dL (ref 3.6–5.1)
Alkaline phosphatase (APISO): 111 U/L (ref 37–153)
BUN: 16 mg/dL (ref 7–25)
CO2: 29 mmol/L (ref 20–32)
Calcium: 10.2 mg/dL (ref 8.6–10.4)
Chloride: 101 mmol/L (ref 98–110)
Creat: 0.63 mg/dL (ref 0.50–1.03)
Globulin: 3 g/dL (ref 1.9–3.7)
Glucose, Bld: 88 mg/dL (ref 65–99)
Potassium: 4.1 mmol/L (ref 3.5–5.3)
Sodium: 140 mmol/L (ref 135–146)
Total Bilirubin: 0.3 mg/dL (ref 0.2–1.2)
Total Protein: 7.4 g/dL (ref 6.1–8.1)
eGFR: 103 mL/min/{1.73_m2} (ref >=60)

## 2023-04-03 LAB — CBC WITH DIFFERENTIAL/PLATELET
Absolute Lymphocytes: 3533 {cells}/uL (ref 850–3900)
Absolute Monocytes: 498 {cells}/uL (ref 200–950)
Basophils Absolute: 27 {cells}/uL (ref 0–200)
Basophils Relative: 0.3 %
Eosinophils Absolute: 383 {cells}/uL (ref 15–500)
Eosinophils Relative: 4.3 %
HCT: 43 % (ref 35.0–45.0)
Hemoglobin: 13.9 g/dL (ref 11.7–15.5)
MCH: 27.6 pg (ref 27.0–33.0)
MCHC: 32.3 g/dL (ref 32.0–36.0)
MCV: 85.5 fL (ref 80.0–100.0)
MPV: 9.5 fL (ref 7.5–12.5)
Monocytes Relative: 5.6 %
Neutro Abs: 4459 {cells}/uL (ref 1500–7800)
Neutrophils Relative %: 50.1 %
Platelets: 371 10*3/uL (ref 140–400)
RBC: 5.03 10*6/uL (ref 3.80–5.10)
RDW: 12.4 % (ref 11.0–15.0)
Total Lymphocyte: 39.7 %
WBC: 8.9 10*3/uL (ref 3.8–10.8)

## 2023-04-03 LAB — TSH: TSH: 0.11 m[IU]/L — ABNORMAL LOW (ref 0.40–4.50)

## 2023-04-03 MED ORDER — TUBERCULIN SYRINGE 26G X 3/8" 1 ML MISC: 0 refills | Status: DC

## 2023-04-03 MED ORDER — METHOTREXATE SODIUM CHEMO INJECTION 50 MG/2ML: 12.5000 mg | INTRAMUSCULAR | 0 refills | Status: DC

## 2023-04-03 NOTE — Telephone Encounter (Signed)
 I spoke with Claire Sullivan about symptoms based on the timing does suggest methotrexate  is the cause although her side effect is unusual.  The recent lab test did not show any problem with blood count and metabolic panel from the medication.  I agree we could try switching to the injectable methotrexate  we decreased the dose to 12.5 mg.  If she continues to experience dizziness and side effects lasting for a whole day after switching we will need to stop and try different medicine.

## 2023-04-03 NOTE — Progress Notes (Signed)
 I spoke with Claire Sullivan about her results blood count and kidney and liver function are normal.  The TSH is low at 0.11 this would suggest thyroid supplement might be slightly high dose.  I do not know if this is causing any actual side effects for her so we will defer any dose adjustment to PCP and will copy them these results.

## 2023-04-03 NOTE — Addendum Note (Signed)
 Addended by: Matt Song on: 04/03/2023 11:52 AM   Modules accepted: Orders

## 2023-04-18 IMAGING — DX DG CHEST 2V
2 series · 2 of 2 positions shown · non-contrast
Comparison: None.

CLINICAL DATA: Left upper chest discomfort since returning from
[place] 1 month prior to admission

EXAM:
CHEST - 2 VIEW

[chest pa]
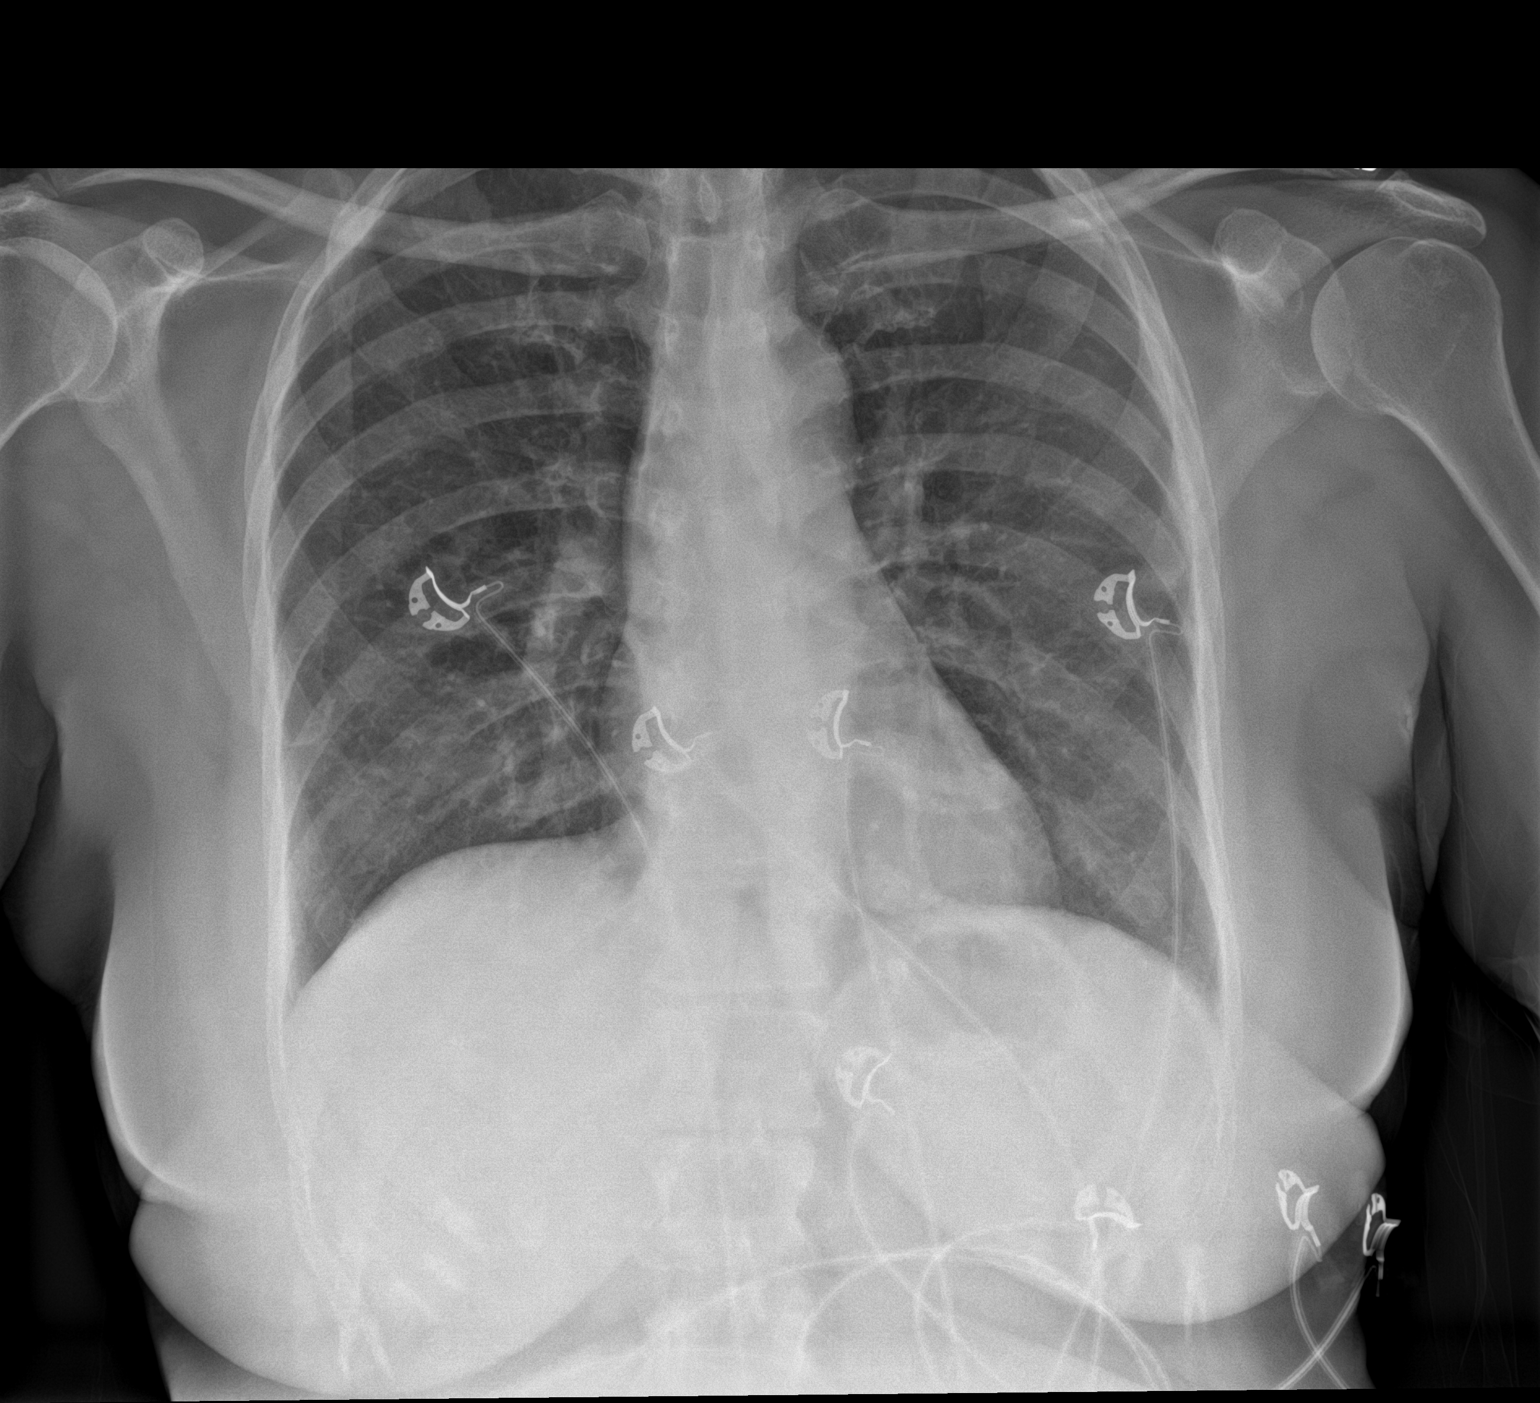

[chest lat]
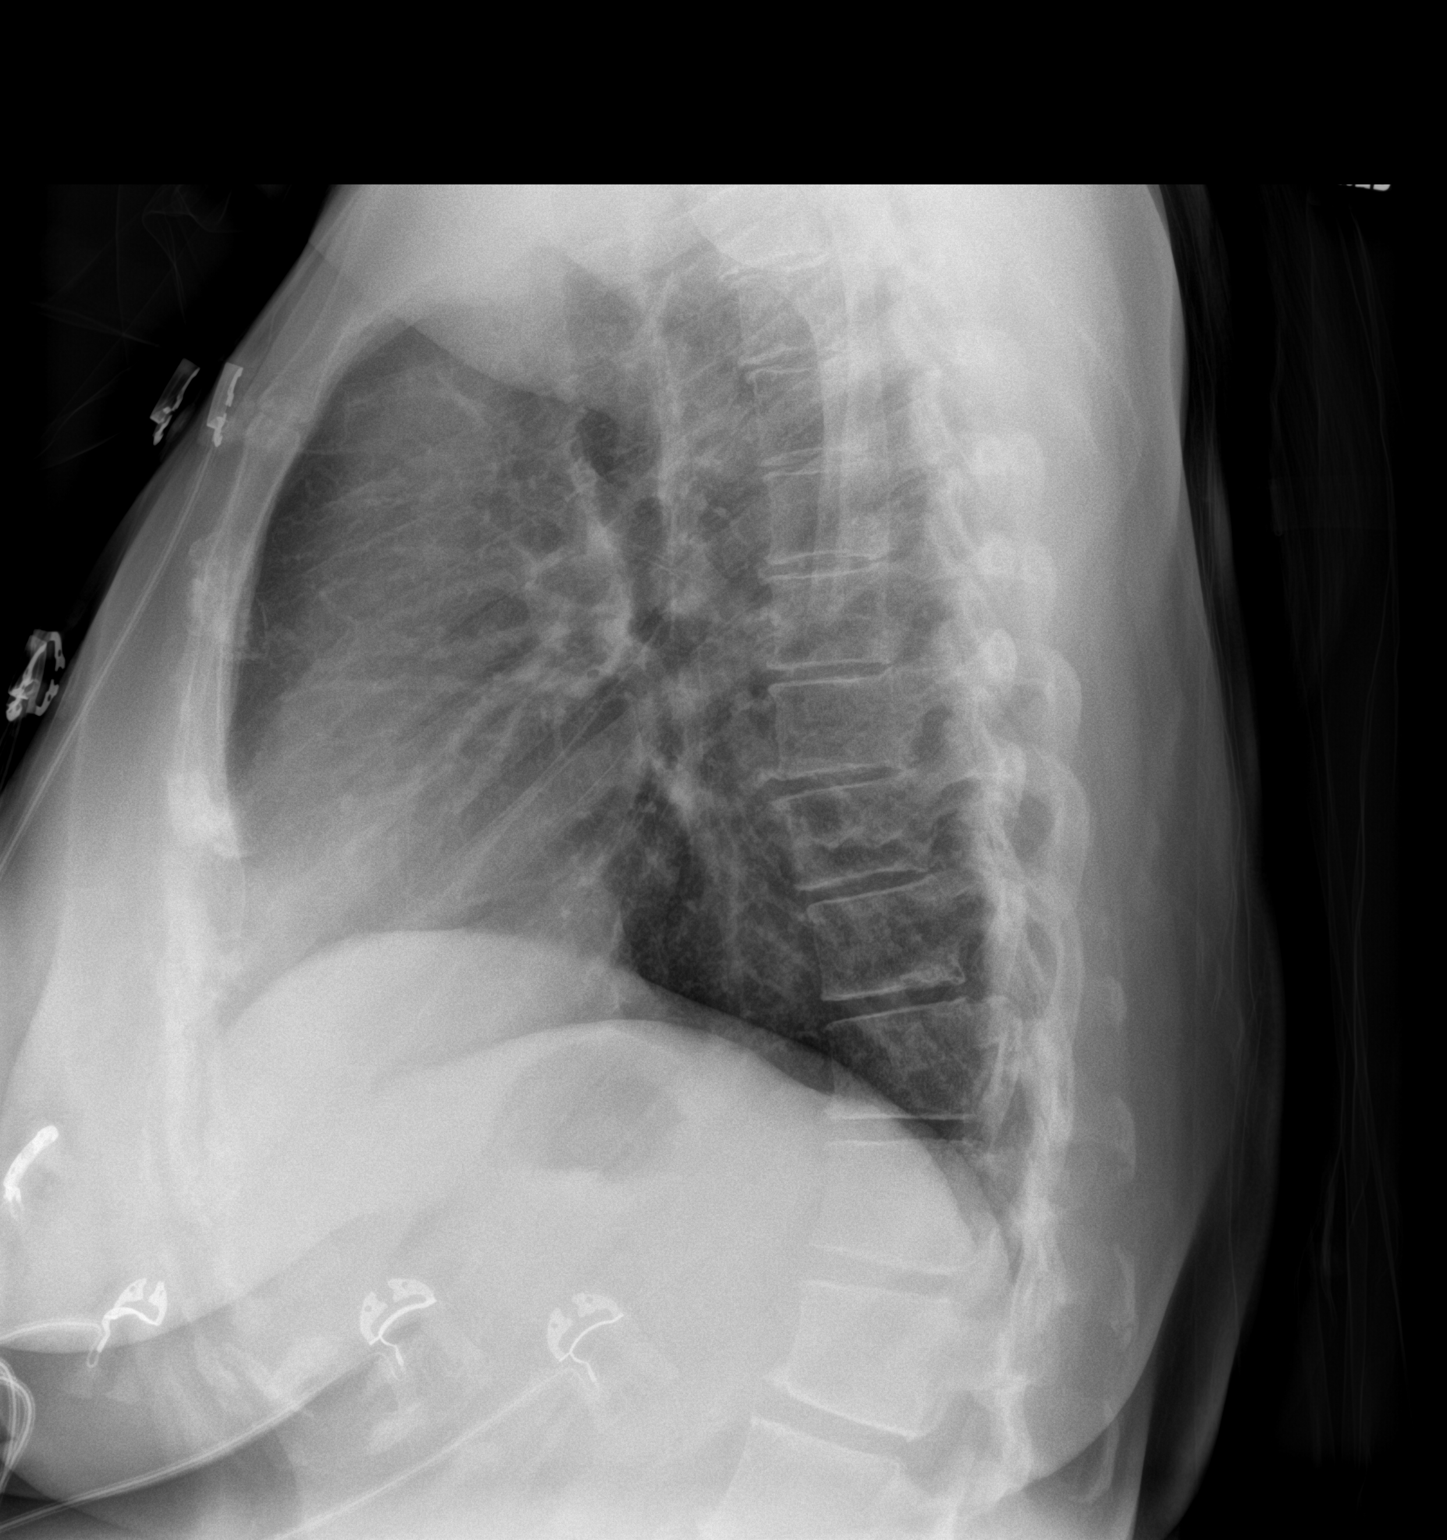

[2 of 2 positions shown; findings below may reference images not displayed]

FINDINGS: Mildly coarsened interstitial changes are nonspecific. No
consolidative opacity, convincing edema, pneumothorax or effusion.
The cardiomediastinal contours are unremarkable. No acute osseous or
soft tissue abnormality. Telemetry leads overlie the chest.
IMPRESSION: Nonspecific coarsened interstitial changes, can be infectious,
inflammatory or chronic.

No other acute cardiopulmonary abnormality.

## 2023-04-19 IMAGING — CT CT ANGIO CHEST
2 of 7 series · 17 of 46 positions shown · IV contrast (omnipaque)
Comparison: None.

CLINICAL DATA: Abdominal pain.

EXAM:
CT ANGIOGRAPHY CHEST WITH CONTRAST
TECHNIQUE: Multidetector CT imaging of the chest was performed using the
standard protocol during bolus administration of intravenous
contrast. Multiplanar CT image reconstructions and MIPs were
obtained to evaluate the vascular anatomy.
CONTRAST:  100mL OMNIPAQUE IOHEXOL 350 MG/ML SOLN

[Series 5: pe axial thins · axial · 0.70mm/px · z∈[-642,-446]mm · 14 of 229 slices shown]
[im 16/229  lung]
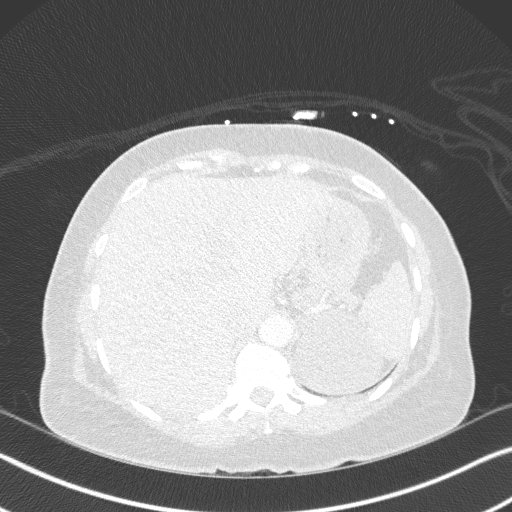
[im 31/229  soft-tissue]
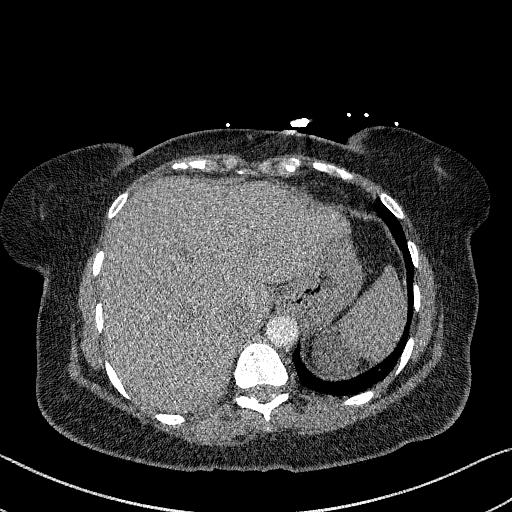
[im 46/229  lung]
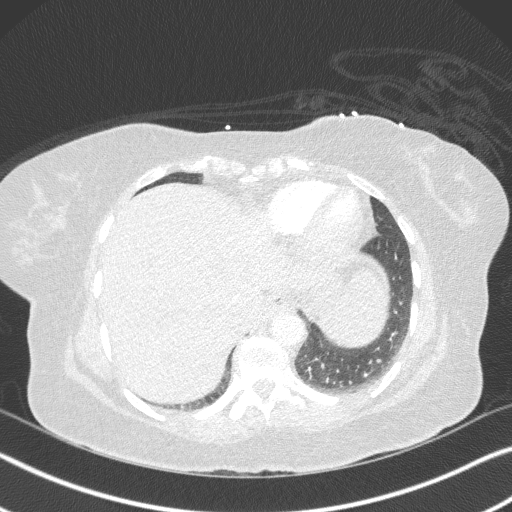
[im 61/229  soft-tissue]
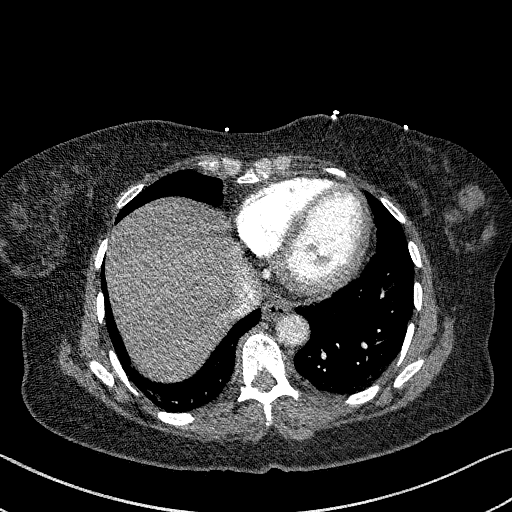
[im 77/229  lung]
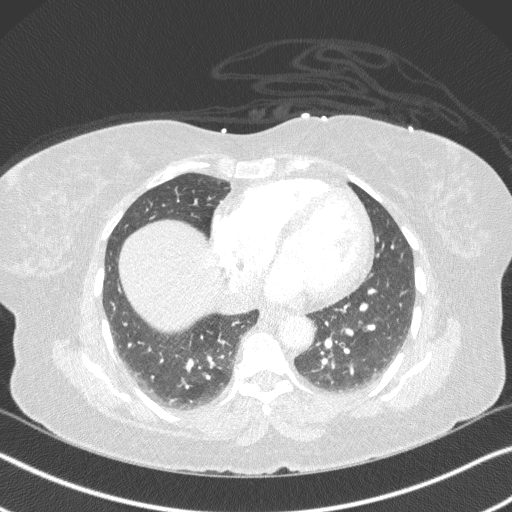
[im 92/229  soft-tissue]
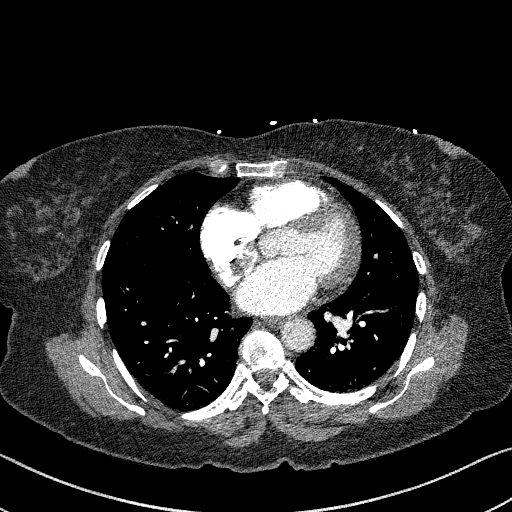
[im 107/229  lung]
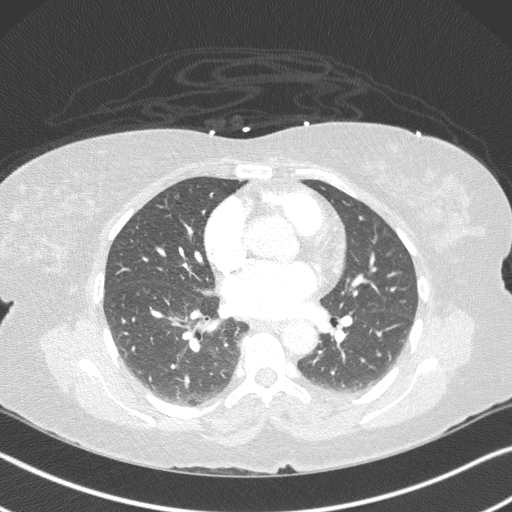
[im 122/229  soft-tissue]
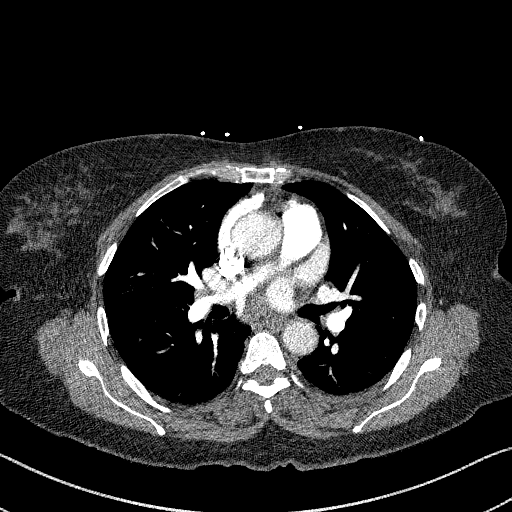
[im 137/229  lung]
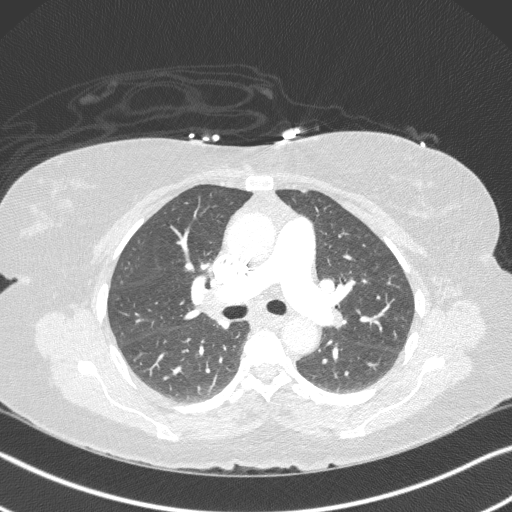
[im 153/229  soft-tissue]
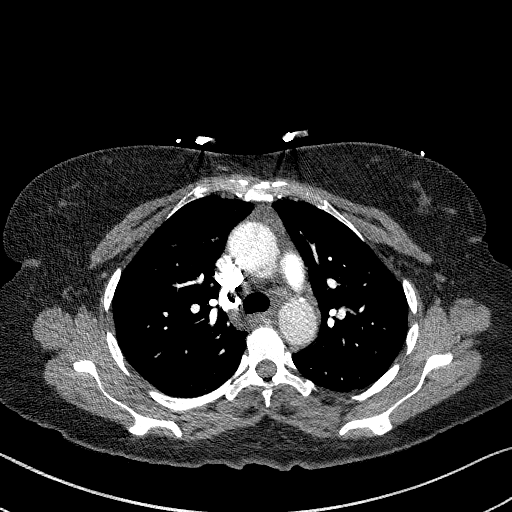
[im 168/229  lung]
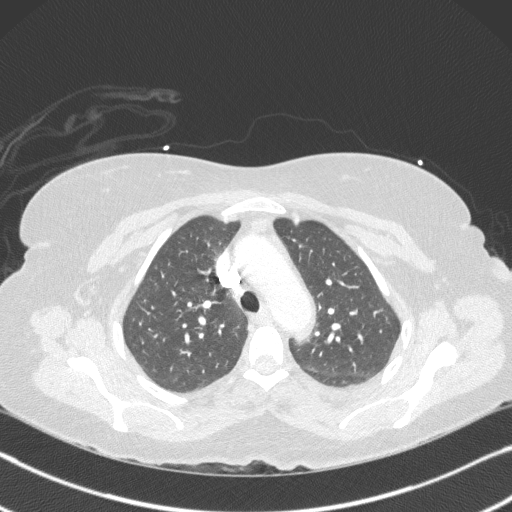
[im 183/229  soft-tissue]
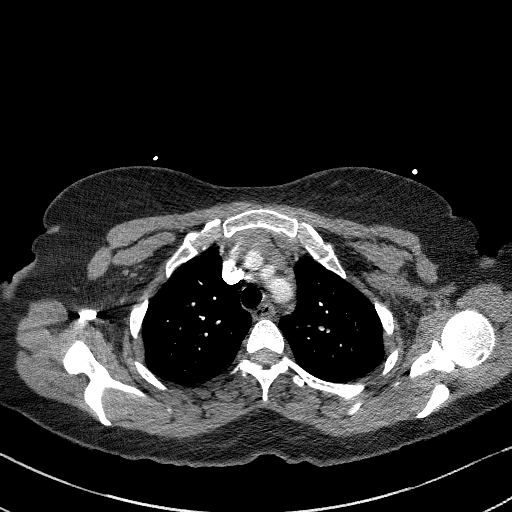
[im 198/229  lung]
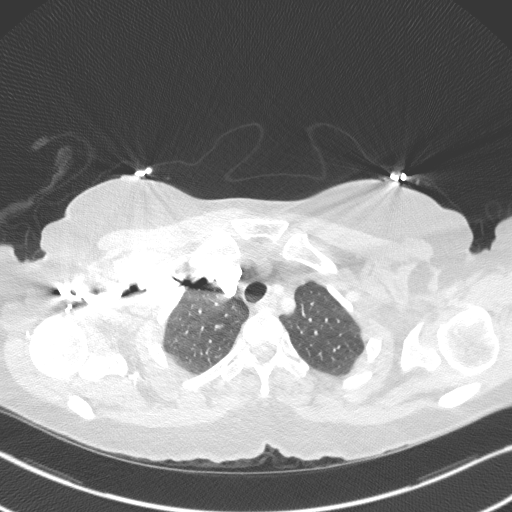
[im 213/229  soft-tissue]
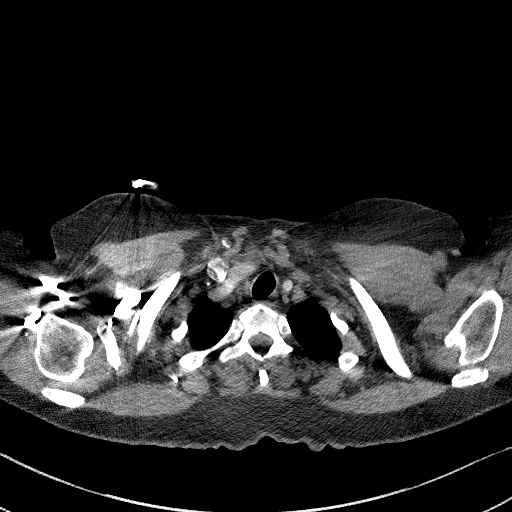

[Series 7: cor soft · coronal · 0.45mm/px · 3 of 105 slices shown]
[im 27/105  soft-tissue]
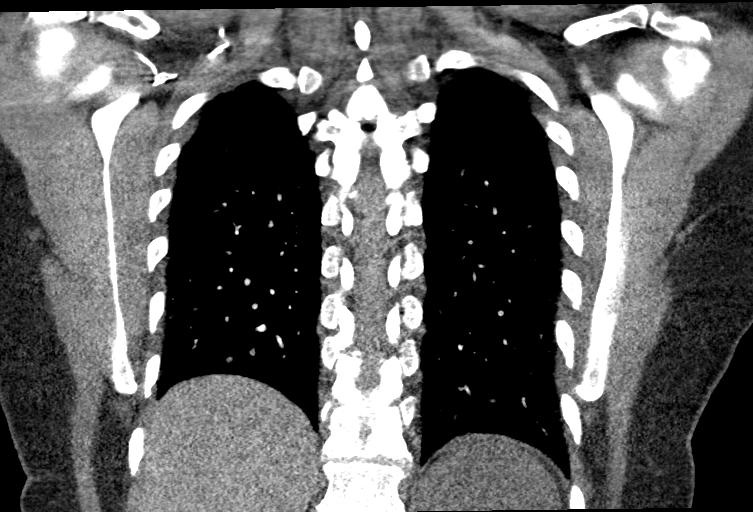
[im 53/105  soft-tissue]
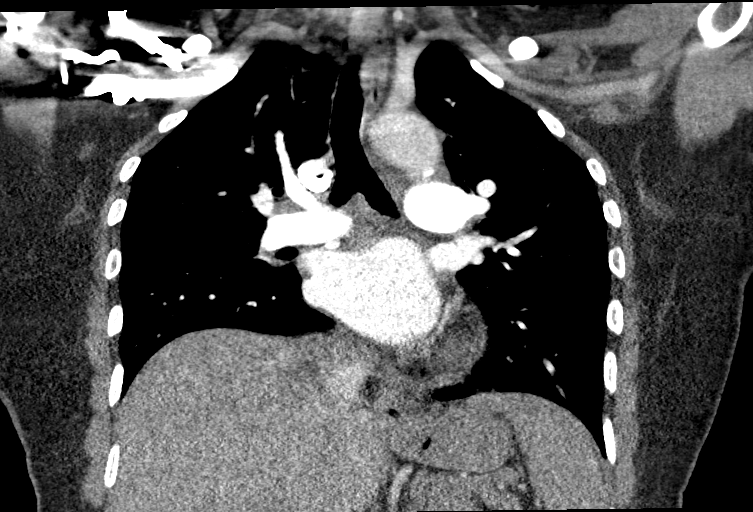
[im 79/105  soft-tissue]
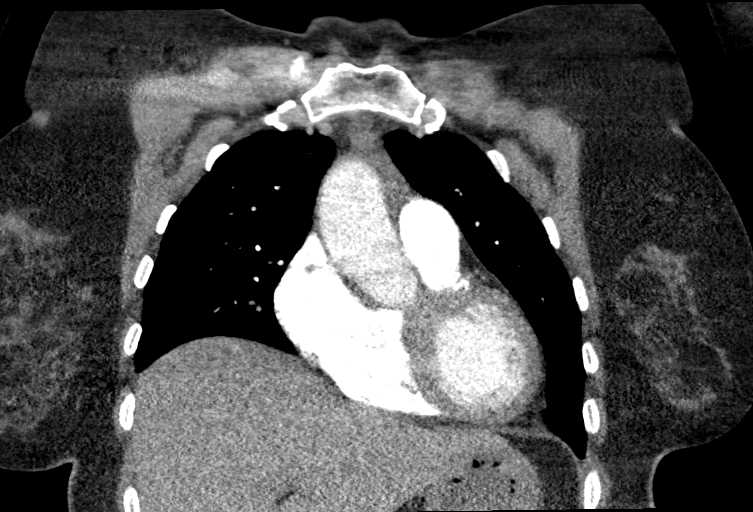

[17 of 46 positions shown; findings below may reference images not displayed]

FINDINGS: Cardiovascular: Satisfactory opacification of the pulmonary arteries
to the segmental level. No evidence of pulmonary embolism. Normal
heart size. No pericardial effusion.

Mediastinum/Nodes: There is mild right hilar lymphadenopathy.
Thyroid gland, trachea, and esophagus demonstrate no significant
findings.

Lungs/Pleura: Lungs are clear. No pleural effusion or pneumothorax.

Upper Abdomen: There is a small hiatal hernia.

A 7.2 cm x 5.4 cm cystic appearing area is seen within the posterior
aspect of the left upper quadrant.

Musculoskeletal: No chest wall abnormality. No acute or significant
osseous findings.

Review of the MIP images confirms the above findings.
IMPRESSION: 1. No evidence of pulmonary embolism or acute cardiopulmonary
disease.
2. Large posterior left upper quadrant cyst. This may be renal in
origin. Correlation with renal ultrasound is recommended.

## 2023-05-03 IMAGING — CT CT ABD-PELV W/ CM
2 of 5 series · 16 of 46 positions shown, 18 images · IV contrast (omnipaque)
Comparison: July 26, 2015.

CLINICAL DATA: Generalized epigastric abdominal pain for 2 months.

EXAM:
CT ABDOMEN AND PELVIS WITH CONTRAST
TECHNIQUE: Multidetector CT imaging of the abdomen and pelvis was performed
using the standard protocol following bolus administration of
intravenous contrast.
CONTRAST:  75mL OMNIPAQUE IOHEXOL 300 MG/ML  SOLN

[Series 2: axial st · axial · 0.72mm/px · z∈[+407,+847]mm · 13 of 100 slices shown, 15 images]
[im 6/100  soft-tissue]
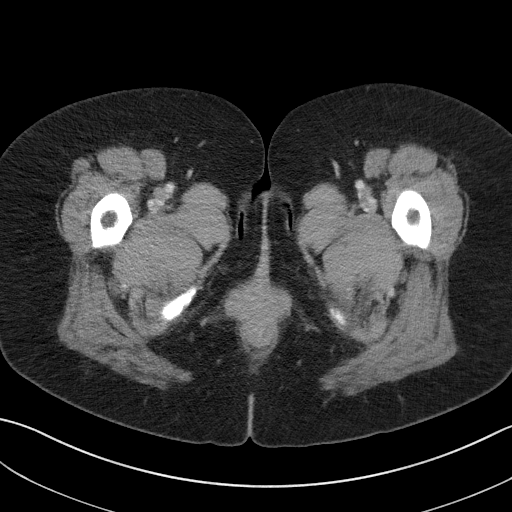
[im 6/100  bone]
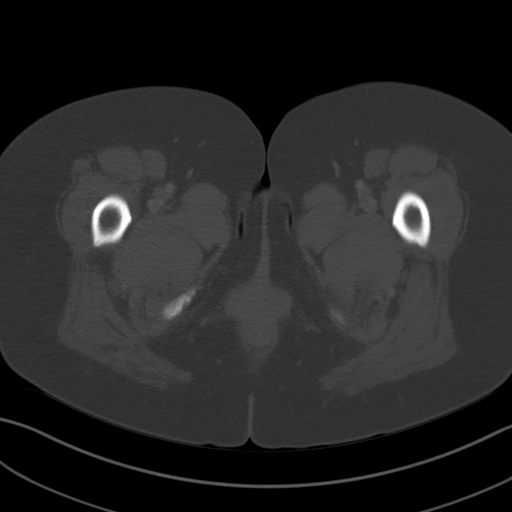
[im 16/100  soft-tissue]
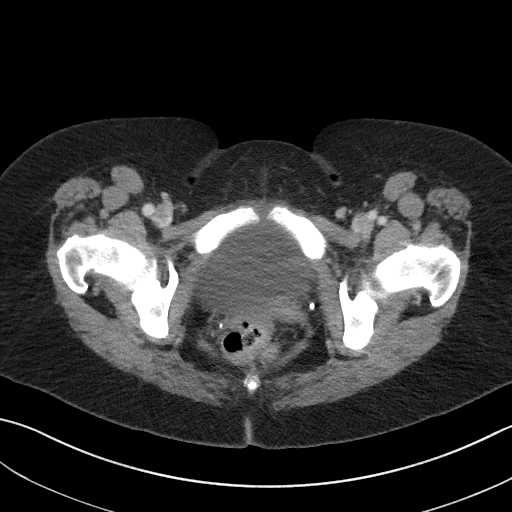
[im 21/100  soft-tissue]
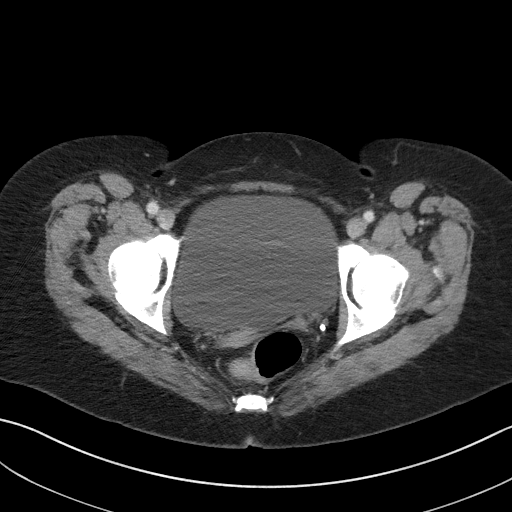
[im 27/100  soft-tissue]
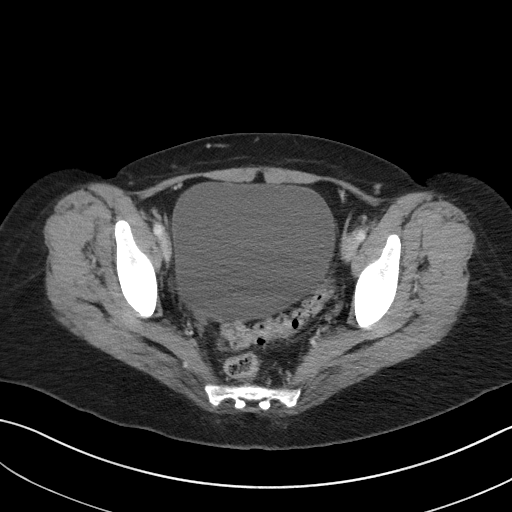
[im 37/100  soft-tissue]
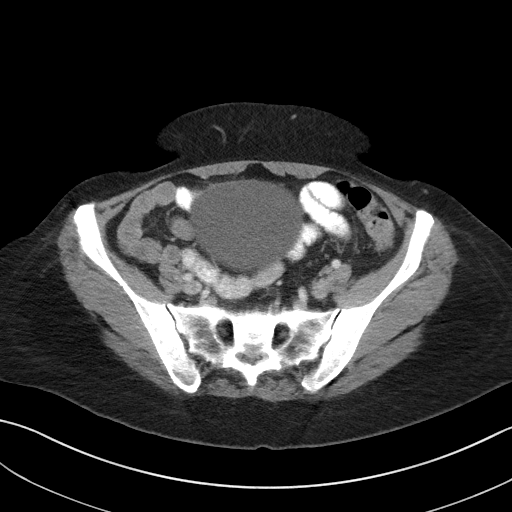
[im 42/100  soft-tissue]
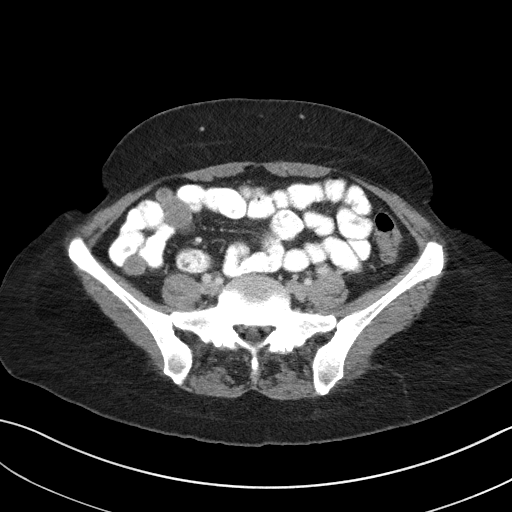
[im 53/100  soft-tissue]
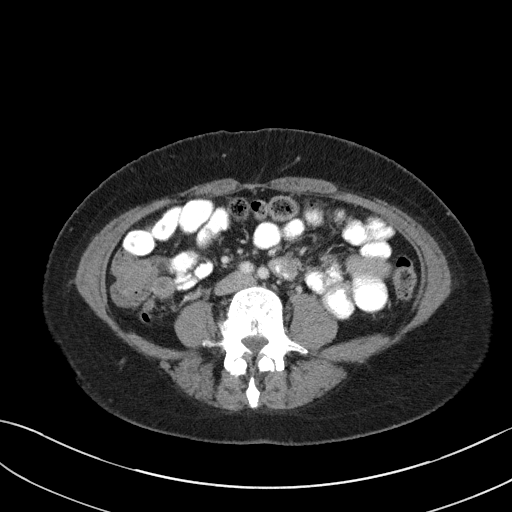
[im 58/100  soft-tissue]
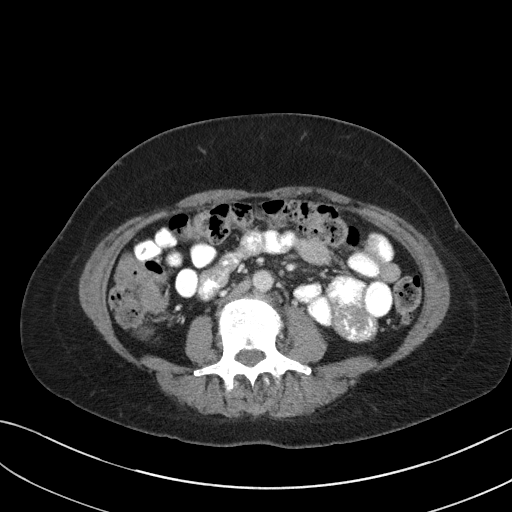
[im 63/100  soft-tissue]
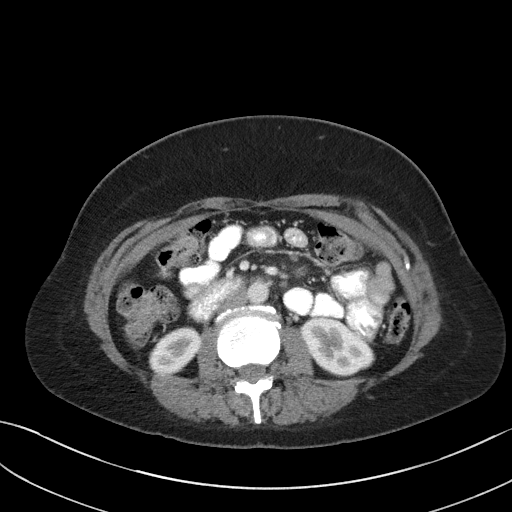
[im 63/100  bone]
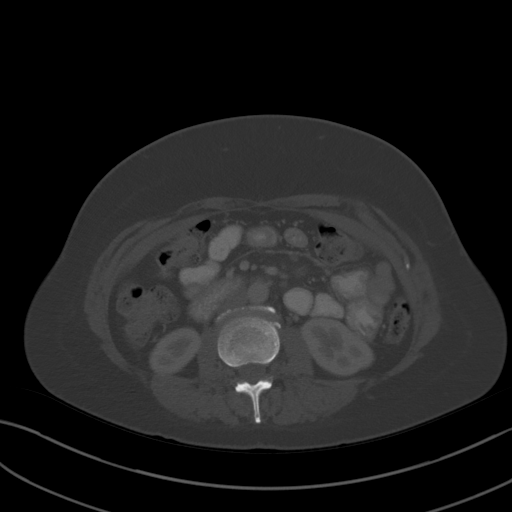
[im 73/100  soft-tissue]
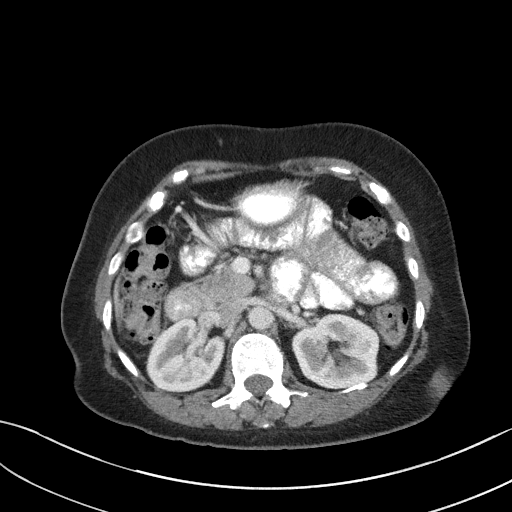
[im 79/100  soft-tissue]
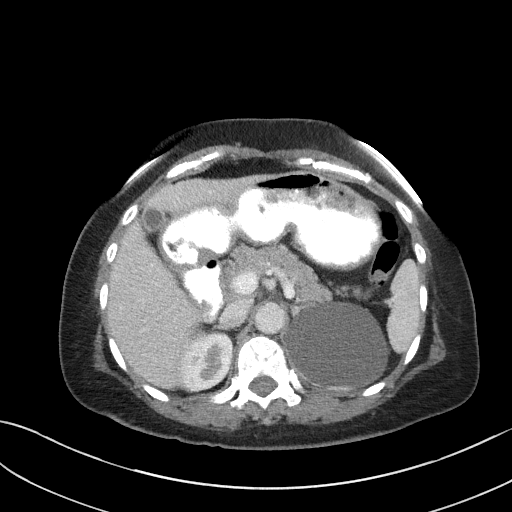
[im 84/100  soft-tissue]
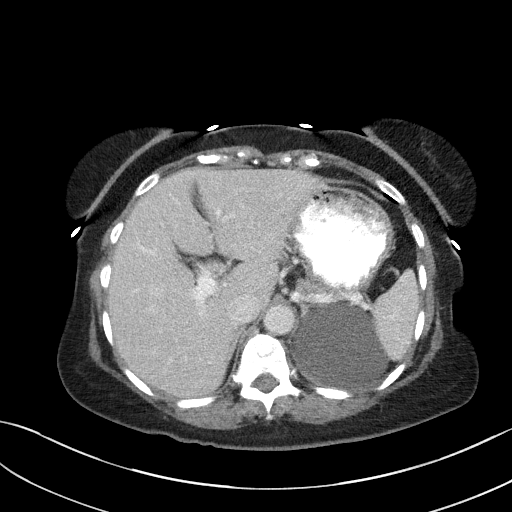
[im 94/100  soft-tissue]
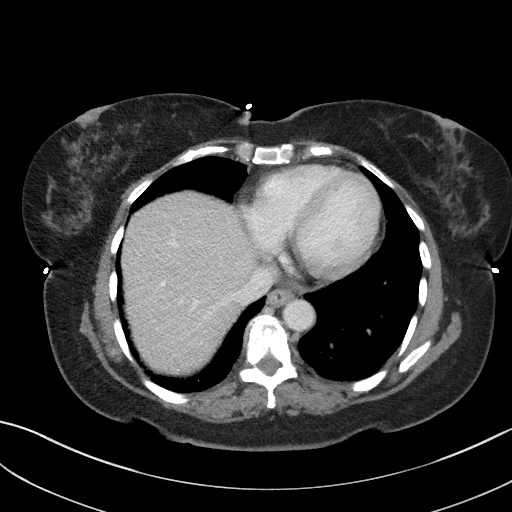

[Series 5: coronal st · coronal · 0.88mm/px · 3 of 88 slices shown]
[im 30/88  soft-tissue]
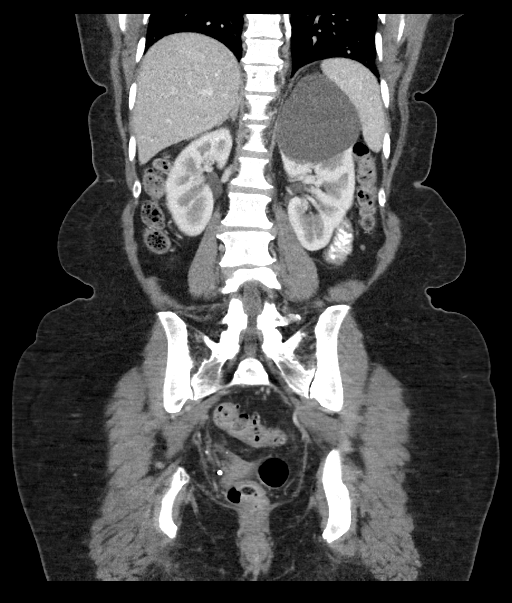
[im 39/88  soft-tissue]
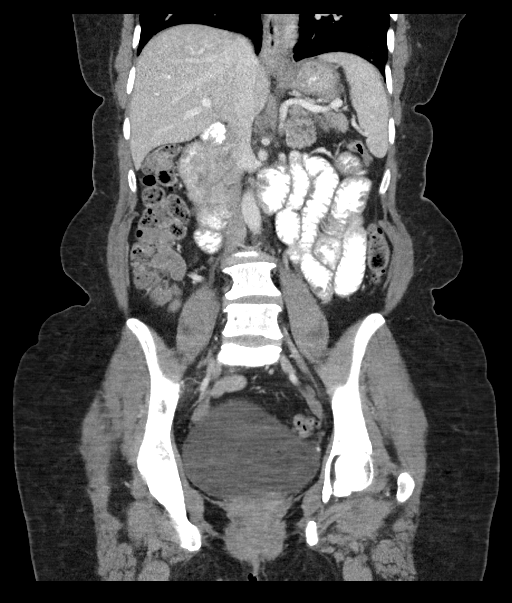
[im 49/88  soft-tissue]
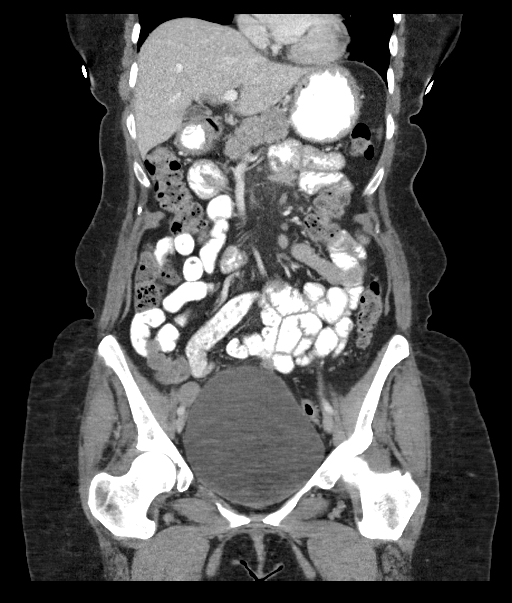

[16 of 46 positions shown; findings below may reference images not displayed]

FINDINGS: Lower chest: No acute abnormality.

Hepatobiliary: No focal liver abnormality is seen. No gallstones,
gallbladder wall thickening, or biliary dilatation.

Pancreas: Unremarkable. No pancreatic ductal dilatation or
surrounding inflammatory changes.

Spleen: Normal in size without focal abnormality.

Adrenals/Urinary Tract: Adrenal glands appear normal. Large left
renal cyst is noted. No hydronephrosis or renal obstruction is
noted. No renal or ureteral calculi are noted. Mild urinary bladder
distention is noted.

Stomach/Bowel: Stomach is within normal limits. Appendix appears
normal. No evidence of bowel wall thickening, distention, or
inflammatory changes.

Vascular/Lymphatic: No significant vascular findings are present. No
enlarged abdominal or pelvic lymph nodes.

Reproductive: Status post hysterectomy. No adnexal masses.

Other: No abdominal wall hernia or abnormality. No abdominopelvic
ascites.

Musculoskeletal: No acute or significant osseous findings.
IMPRESSION: Mild urinary bladder distention is noted. No other significant
abnormality seen in the abdomen or pelvis.

## 2023-05-05 NOTE — Progress Notes (Signed)
 Office Visit Note  Patient: Claire Sullivan             Date of Birth: 01-09-1966           MRN: [id number]             PCP: Galvin Proffer, MD Referring: Galvin Proffer, MD Visit Date: 05/19/2023   Subjective:  Follow-up (Patient states she feels the injection is not working, she feels the same. Patient states even her foot feels the same. Patient states driving is painful. )   History of Present Illness: Claire Sullivan is a 58 y.o. female here for follow up for chronic joint pain in multiple areas with a highly positive rheumatoid factor now on methotrexate 1.5 mg subcu weekly and folic acid 1 mg daily.  We also tried treating with a local steroid injection in the foot at our visit in January.  So far she has not seen any appreciable improvement in her joint symptoms.  She has pain affecting both hands that she describes as a severe tiredness or fatigue.  Also continues having left foot pain in the ball of the foot and has to walk keeping her weight limited to just on the heel.  Each time taking the methotrexate shot she feels very drained and fatigue lasting for 2 to 3 days.  Also sometimes feels a sense of heat and bodyaches immediately afterwards.  Previous HPI 03/20/2023 Claire Sullivan is a 58 y.o. female here for follow up for chronic joint pain in multiple areas with a highly positive rheumatoid factor.  She presents with ongoing pain in the fingers and backside of the legs, which has been worsening over the past three weeks. The pain is more severe on one side and has been so debilitating that it has affected their ability to walk. The patient also reports stiffness in the legs, making walking very painful. They have sought relief from a pharmacist and through acupuncture, but the pain persists.   In addition to the pain, the patient has noticed swelling in one foot, which has been observed to have two different colors. This has raised concerns from their acupuncturist. The patient has also  sought help from a foot doctor, who suggested surgery, but the patient is unable to undergo the procedure.   The patient also reports pain and weakness in the hands, which has been present for the past seven to eight months. This has affected their ability to perform tasks such as opening bottles. Despite these symptoms, the patient has not noticed any swelling in the hands.   The patient has tried various medications for pain relief, including Celebrex and Tylenol, but these have not been effective. They have also received an injection for the foot pain, which only exacerbated the symptoms. The patient has expressed a desire for a quick-acting treatment to alleviate the pain.      Previous HPI 09/17/2022 Claire Sullivan is a 58 y.o. female here for follow up for chronic joint pain in multiple areas with a highly positive rheumatoid factor.  Since last visit she only took the Celebrex a few times she is very concerned about side effects with NSAID use particular because her father saw for renal failure and hemodialysis.  She started taking a mushroom coffee supplement feels this is helping her overall joint inflammation.  Worst problem area is in the big toe of both feet she gets redness and swelling and pain this gets worse after prolonged walking.  Previously was able  to walk for 3 miles uninterrupted now gets activity limiting pain after 1 mile.   Previous HPI 06/18/2022 Claire Sullivan is a 58 y.o. female here for follow up for chronic joint pain in multiple areas with a highly positive rheumatoid factor.  Workup at our initial visit was negative for any elevation in serum inflammatory markers and x-rays showing generalized primary osteoarthritis changes.  Besides the continued joint pains as seen before he is also noticing some increased pain at the big toes of her feet and frequently getting cold sensation in both feet.   Previous HPI 05/28/22 Claire Sullivan is a 58 y.o. female here for evaluation of  joint pain concern for possible rheumatoid arthritis with highly elevated rheumatoid factor.  She has pain affecting both knees most noticeable when she is trying to get up from on the floor has significant difficulty.  She had previous evaluation by orthopedic surgery in 2015 with x-rays showing degenerative arthritis on both sides.  She takes Tylenol as needed but more recently uses equate which is more beneficial.  Does not notice any visible swelling throughout her knees or legs.  She also has chronic low back pain ongoing for years.  For about the past 1 year started having pain and stiffness affecting both hands.  This is localized at the PIP joints.  She does not notice any time of day or activity that seem to improve or worsen this.  Does not have visible swelling or discoloration.  She does have trouble with tightly gripping and drops items and utensils occasionally. She also notices bunions on both feet since about 1 year. She has morning stiffness usually 30 minutes or less daily. No new rashes. No vision changes or eye inflammation.   Labs reviewed 12/2021 RF 283.4 ESR 14 Uric acid 2.8   Review of Systems  Constitutional:  Positive for fatigue.  HENT:  Negative for mouth sores and mouth dryness.   Eyes:  Negative for dryness.  Respiratory:  Negative for shortness of breath.   Cardiovascular:  Negative for chest pain and palpitations.  Gastrointestinal:  Negative for blood in stool, constipation and diarrhea.  Endocrine: Negative for increased urination.  Genitourinary:  Negative for involuntary urination.  Musculoskeletal:  Positive for joint pain, joint pain, myalgias, morning stiffness and myalgias. Negative for gait problem, joint swelling, muscle weakness and muscle tenderness.  Skin:  Negative for color change, rash, hair loss and sensitivity to sunlight.  Allergic/Immunologic: Negative for susceptible to infections.  Neurological:  Negative for dizziness and headaches.   Hematological:  Negative for swollen glands.  Psychiatric/Behavioral:  Negative for depressed mood and sleep disturbance. The patient is not nervous/anxious.     PMFS History:  Patient Active Problem List   Diagnosis Date Noted   High risk medication use 03/20/2023   Seropositive rheumatoid arthritis (HCC) 05/28/2022   Low back pain 05/28/2022   Bilateral hand pain 05/28/2022   Acquired hypothyroidism 08/15/2014   Localized swelling of both lower extremities 08/15/2014   Primary osteoarthritis of left knee 10/17/2013   Primary osteoarthritis of right knee 10/17/2013    Past Medical History:  Diagnosis Date   Anxiety    Hypothyroidism     Family History  Problem Relation Age of Onset   CVA Mother    Breast cancer Mother    Kidney disease Father    Colon cancer Neg Hx    Esophageal cancer Neg Hx    Stomach cancer Neg Hx    Past Surgical History:  Procedure Laterality Date   LAPAROSCOPIC HYSTERECTOMY     THYROIDECTOMY, PARTIAL     Social History   Social History Narrative   Not on file   Immunization History  Administered Date(s) Administered   Moderna Sars-Covid-2 Vaccination 06/16/2019, 07/14/2019     Objective: Vital Signs: BP 97/67 (BP Location: Left Arm, Patient Position: Sitting, Cuff Size: Large)   Pulse 76   Resp 14   Ht 5\' 5"  (1.651 m)   Wt 182 lb (82.6 kg)   BMI 30.29 kg/m    Physical Exam Eyes:     Conjunctiva/sclera: Conjunctivae normal.  Cardiovascular:     Rate and Rhythm: Normal rate and regular rhythm.  Pulmonary:     Effort: Pulmonary effort is normal.     Breath sounds: Normal breath sounds.  Lymphadenopathy:     Cervical: No cervical adenopathy.  Skin:    General: Skin is warm and dry.  Neurological:     Mental Status: She is alert.  Psychiatric:        Mood and Affect: Mood normal.      Musculoskeletal Exam:  Shoulders full ROM no tenderness or swelling Elbows full ROM no tenderness or swelling Wrists full ROM no  tenderness or swelling Fingers full ROM, very mild first CMC joint squaring, some tenderness to pressure without palpable synovitis Knees full ROM no tenderness or swelling Ankles full ROM no tenderness or swelling Tenderness to pressure around MTP joints and on the distal foot with no palpable swelling erythema or warmth   Investigation: No additional findings.  Imaging: No results found.  Recent Labs: Lab Results  Component Value Date   WBC 8.9 04/02/2023   HGB 13.9 04/02/2023   PLT 371 04/02/2023   NA 140 04/02/2023   K 4.1 04/02/2023   CL 101 04/02/2023   CO2 29 04/02/2023   GLUCOSE 88 04/02/2023   BUN 16 04/02/2023   CREATININE 0.63 04/02/2023   BILITOT 0.3 04/02/2023   ALKPHOS 88 06/28/2020   AST 16 04/02/2023   ALT 15 04/02/2023   PROT 7.4 04/02/2023   ALBUMIN 3.8 06/28/2020   CALCIUM 10.2 04/02/2023   GFRAA >60 09/12/2018    Speciality Comments: No specialty comments available.  Procedures:  No procedures performed Allergies: Patient has no known allergies.   Assessment / Plan:     Visit Diagnoses: Seropositive rheumatoid arthritis (HCC) - Plan: DULoxetine (CYMBALTA) 30 MG capsule No appreciable benefit with repeat trial of steroid injection and with methotrexate now for 2 months.  She still has no peripheral synovitis and has normal inflammatory markers.  Still suspicious given the very high antibodies but hard to say if the amount of pain is proportional to the disease activity.  Will stop methotrexate because of poor tolerance and no improvement.  Will try starting treatment with Cymbalta for more neuropathic type pain.  If there is disease flare or active swelling increases off methotrexate could revisit DMARD would have a low threshold for Biologics. -Stop methotrexate and folic acid -Start Cymbalta 30 mg daily with titration to 60 mg daily after 2 weeks if tolerating  High risk medication use - Celebrex 100 mg twice daily as needed. Start Methotrexate  15mg  PO once weekly. Start Folic Acid 1mg  daily. Patient taking methotrexate 12.5 mg SQ weekly. February labs were normal with no change in blood count or metabolic panel on the methotrexate.  But not tolerating well just overall symptomatically.  No repeat labs needed today since changing treatment plan as above.  Orders: No orders of the defined types were placed in this encounter.  Meds ordered this encounter  Medications   DULoxetine (CYMBALTA) 30 MG capsule    Sig: Take 1 tablet by mouth daily for 2 weeks. If tolerating, then increase to 2 tablets by mouth daily.    Dispense:  60 capsule    Refill:  1     Follow-Up Instructions: Return in about 2 months (around 07/19/2023) for ?RA CYM start f/u 2mos.   Fuller Plan, MD  Note - This record has been created using [employer].  Chart creation errors have been sought, but may not always  have been located. Such creation errors do not reflect on  the standard of medical care.

## 2023-05-17 ENCOUNTER — Other Ambulatory Visit: Payer: Self-pay | Admitting: Internal Medicine

## 2023-05-17 DIAGNOSIS — M059 Rheumatoid arthritis with rheumatoid factor, unspecified: Secondary | ICD-10-CM

## 2023-05-18 NOTE — Telephone Encounter (Signed)
 Last Fill: 04/03/2023  Labs: 04/02/2023 I spoke with Claire Sullivan about her results blood count and kidney and liver function are normal.  The TSH is low at 0.11 this would suggest thyroid supplement might be slightly high dose.  I do not know if this is causing any actual side effects for her so we will defer any dose adjustment to PCP and will copy them these results.   Next Visit: 05/19/2023  Last Visit: 03/20/2023  DX: Seropositive rheumatoid arthritis (HCC)   Current Dose per office note 03/20/2023: Start Methotrexate 15mg  PO once weekly.   Per telephone note on 04/02/2023: I agree we could try switching to the injectable methotrexate we decreased the dose to 12.5 mg.   Okay to refill Methotrexate?

## 2023-05-19 ENCOUNTER — Ambulatory Visit: Payer: BC Managed Care – PPO | Attending: Internal Medicine | Admitting: Internal Medicine

## 2023-05-19 ENCOUNTER — Encounter: Payer: Self-pay | Admitting: Internal Medicine

## 2023-05-19 VITALS — BP 97/67 | HR 76 | Resp 14 | Ht 65.0 in | Wt 182.0 lb

## 2023-05-19 DIAGNOSIS — M059 Rheumatoid arthritis with rheumatoid factor, unspecified: Secondary | ICD-10-CM

## 2023-05-19 DIAGNOSIS — M79642 Pain in left hand: Secondary | ICD-10-CM | POA: Diagnosis not present

## 2023-05-19 DIAGNOSIS — Z79899 Other long term (current) drug therapy: Secondary | ICD-10-CM | POA: Diagnosis not present

## 2023-05-19 DIAGNOSIS — M79641 Pain in right hand: Secondary | ICD-10-CM | POA: Diagnosis not present

## 2023-05-19 MED ORDER — DULOXETINE HCL 30 MG PO CPEP: ORAL_CAPSULE | ORAL | 1 refills | Status: AC

## 2023-07-13 NOTE — Progress Notes (Deleted)
 Office Visit Note  Patient: Claire Sullivan             Date of Birth: 04/04/1965           MRN: [id number]             PCP: Georgean Kindle, MD Referring: Georgean Kindle, MD Visit Date: 07/27/2023   Subjective:  No chief complaint on file.   History of Present Illness: Claire Sullivan is a 58 y.o. female here for follow up for chronic joint pain in multiple areas with a highly positive rheumatoid factor    Previous HPI 05/19/2023 Claire Sullivan is a 58 y.o. female here for follow up for chronic joint pain in multiple areas with a highly positive rheumatoid factor now on methotrexate  1.5 mg subcu weekly and folic acid  1 mg daily.  We also tried treating with a local steroid injection in the foot at our visit in January.  So far she has not seen any appreciable improvement in her joint symptoms.  She has pain affecting both hands that she describes as a severe tiredness or fatigue.  Also continues having left foot pain in the ball of the foot and has to walk keeping her weight limited to just on the heel.  Each time taking the methotrexate  shot she feels very drained and fatigue lasting for 2 to 3 days.  Also sometimes feels a sense of heat and bodyaches immediately afterwards.   Previous HPI 03/20/2023 Claire Sullivan is a 58 y.o. female here for follow up for chronic joint pain in multiple areas with a highly positive rheumatoid factor.  She presents with ongoing pain in the fingers and backside of the legs, which has been worsening over the past three weeks. The pain is more severe on one side and has been so debilitating that it has affected their ability to walk. The patient also reports stiffness in the legs, making walking very painful. They have sought relief from a pharmacist and through acupuncture, but the pain persists.   In addition to the pain, the patient has noticed swelling in one foot, which has been observed to have two different colors. This has raised concerns from their  acupuncturist. The patient has also sought help from a foot doctor, who suggested surgery, but the patient is unable to undergo the procedure.   The patient also reports pain and weakness in the hands, which has been present for the past seven to eight months. This has affected their ability to perform tasks such as opening bottles. Despite these symptoms, the patient has not noticed any swelling in the hands.   The patient has tried various medications for pain relief, including Celebrex  and Tylenol, but these have not been effective. They have also received an injection for the foot pain, which only exacerbated the symptoms. The patient has expressed a desire for a quick-acting treatment to alleviate the pain.      Previous HPI 09/17/2022 Claire Sullivan is a 58 y.o. female here for follow up for chronic joint pain in multiple areas with a highly positive rheumatoid factor.  Since last visit she only took the Celebrex  a few times she is very concerned about side effects with NSAID use particular because her father saw for renal failure and hemodialysis.  She started taking a mushroom coffee supplement feels this is helping her overall joint inflammation.  Worst problem area is in the big toe of both feet she gets redness and swelling and pain this gets  worse after prolonged walking.  Previously was able to walk for 3 miles uninterrupted now gets activity limiting pain after 1 mile.   Previous HPI 06/18/2022 Claire Sullivan is a 58 y.o. female here for follow up for chronic joint pain in multiple areas with a highly positive rheumatoid factor.  Workup at our initial visit was negative for any elevation in serum inflammatory markers and x-rays showing generalized primary osteoarthritis changes.  Besides the continued joint pains as seen before he is also noticing some increased pain at the big toes of her feet and frequently getting cold sensation in both feet.   Previous HPI 05/28/22 Claire Sullivan is a  58 y.o. female here for evaluation of joint pain concern for possible rheumatoid arthritis with highly elevated rheumatoid factor.  She has pain affecting both knees most noticeable when she is trying to get up from on the floor has significant difficulty.  She had previous evaluation by orthopedic surgery in 2015 with x-rays showing degenerative arthritis on both sides.  She takes Tylenol as needed but more recently uses equate which is more beneficial.  Does not notice any visible swelling throughout her knees or legs.  She also has chronic low back pain ongoing for years.  For about the past 1 year started having pain and stiffness affecting both hands.  This is localized at the PIP joints.  She does not notice any time of day or activity that seem to improve or worsen this.  Does not have visible swelling or discoloration.  She does have trouble with tightly gripping and drops items and utensils occasionally. She also notices bunions on both feet since about 1 year. She has morning stiffness usually 30 minutes or less daily. No new rashes. No vision changes or eye inflammation.   Labs reviewed 12/2021 RF 283.4 ESR 14 Uric acid 2.8   No Rheumatology ROS completed.   PMFS History:  Patient Active Problem List   Diagnosis Date Noted   High risk medication use 03/20/2023   Seropositive rheumatoid arthritis (HCC) 05/28/2022   Low back pain 05/28/2022   Bilateral hand pain 05/28/2022   Acquired hypothyroidism 08/15/2014   Localized swelling of both lower extremities 08/15/2014   Primary osteoarthritis of left knee 10/17/2013   Primary osteoarthritis of right knee 10/17/2013    Past Medical History:  Diagnosis Date   Anxiety    Hypothyroidism     Family History  Problem Relation Age of Onset   CVA Mother    Breast cancer Mother    Kidney disease Father    Colon cancer Neg Hx    Esophageal cancer Neg Hx    Stomach cancer Neg Hx    Past Surgical History:  Procedure Laterality Date    LAPAROSCOPIC HYSTERECTOMY     THYROIDECTOMY, PARTIAL     Social History   Social History Narrative   Not on file   Immunization History  Administered Date(s) Administered   Moderna Sars-Covid-2 Vaccination 06/16/2019, 07/14/2019     Objective: Vital Signs: There were no vitals taken for this visit.   Physical Exam   Musculoskeletal Exam: ***  CDAI Exam: CDAI Score: -- Patient Global: --; Provider Global: -- Swollen: --; Tender: -- Joint Exam 07/27/2023   No joint exam has been documented for this visit   There is currently no information documented on the homunculus. Go to the Rheumatology activity and complete the homunculus joint exam.  Investigation: No additional findings.  Imaging: No results found.  Recent Labs: Lab  Results  Component Value Date   WBC 8.9 04/02/2023   HGB 13.9 04/02/2023   PLT 371 04/02/2023   NA 140 04/02/2023   K 4.1 04/02/2023   CL 101 04/02/2023   CO2 29 04/02/2023   GLUCOSE 88 04/02/2023   BUN 16 04/02/2023   CREATININE 0.63 04/02/2023   BILITOT 0.3 04/02/2023   ALKPHOS 88 06/28/2020   AST 16 04/02/2023   ALT 15 04/02/2023   PROT 7.4 04/02/2023   ALBUMIN 3.8 06/28/2020   CALCIUM 10.2 04/02/2023   GFRAA >60 09/12/2018    Speciality Comments: No specialty comments available.  Procedures:  No procedures performed Allergies: Patient has no known allergies.   Assessment / Plan:     Visit Diagnoses: No diagnosis found.  ***  Orders: No orders of the defined types were placed in this encounter.  No orders of the defined types were placed in this encounter.    Follow-Up Instructions: No follow-ups on file.   Glena Landau, RT  Note - This record has been created using [employer].  Chart creation errors have been sought, but may not always  have been located. Such creation errors do not reflect on  the standard of medical care.

## 2023-07-27 ENCOUNTER — Ambulatory Visit: Admitting: Internal Medicine

## 2023-07-27 DIAGNOSIS — Z79899 Other long term (current) drug therapy: Secondary | ICD-10-CM

## 2023-07-27 DIAGNOSIS — M059 Rheumatoid arthritis with rheumatoid factor, unspecified: Secondary | ICD-10-CM
